# Patient Record
Sex: Male | Born: 1965
Health system: Southern US, Community
[De-identification: ages and names within clinical notes are randomized; demographics above are authoritative.]

## PROBLEM LIST (undated history)

## (undated) DIAGNOSIS — F909 Attention-deficit hyperactivity disorder, unspecified type: Secondary | ICD-10-CM

## (undated) DIAGNOSIS — I10 Essential (primary) hypertension: Secondary | ICD-10-CM

## (undated) DIAGNOSIS — M1990 Unspecified osteoarthritis, unspecified site: Secondary | ICD-10-CM

## (undated) HISTORY — DX: Essential (primary) hypertension: I10

## (undated) HISTORY — PX: COLONOSCOPY: SHX174

## (undated) HISTORY — PX: KNEE SURGERY: SHX244

## (undated) HISTORY — DX: Unspecified osteoarthritis, unspecified site: M19.90

---

## 2009-01-19 HISTORY — PX: APPENDECTOMY: SHX54

## 2009-02-08 ENCOUNTER — Emergency Department (HOSPITAL_COMMUNITY): Admission: EM | Admit: 2009-02-08 | Discharge: 2009-02-08 | Payer: Self-pay | Admitting: Family Medicine

## 2009-02-08 ENCOUNTER — Encounter (INDEPENDENT_AMBULATORY_CARE_PROVIDER_SITE_OTHER): Payer: Self-pay

## 2009-02-08 ENCOUNTER — Ambulatory Visit (HOSPITAL_COMMUNITY): Admission: EM | Admit: 2009-02-08 | Discharge: 2009-02-09 | Payer: Self-pay | Admitting: Emergency Medicine

## 2010-04-07 LAB — CBC
HCT: 41.9 % (ref 39.0–52.0)
MCV: 92 fL (ref 78.0–100.0)
RBC: 4.55 MIL/uL (ref 4.22–5.81)
WBC: 9.4 10*3/uL (ref 4.0–10.5)

## 2010-04-07 LAB — DIFFERENTIAL
Basophils Absolute: 0 10*3/uL (ref 0.0–0.1)
Basophils Relative: 0 % (ref 0–1)
Eosinophils Absolute: 0.1 10*3/uL (ref 0.0–0.7)
Eosinophils Relative: 1 % (ref 0–5)
Lymphocytes Relative: 10 % — ABNORMAL LOW (ref 12–46)

## 2010-04-07 LAB — COMPREHENSIVE METABOLIC PANEL
AST: 28 U/L (ref 0–37)
Alkaline Phosphatase: 40 U/L (ref 39–117)
CO2: 27 mEq/L (ref 19–32)
Chloride: 102 mEq/L (ref 96–112)
Creatinine, Ser: 0.81 mg/dL (ref 0.4–1.5)
GFR calc Af Amer: >60 mL/min (ref >60)
GFR calc non Af Amer: >60 mL/min (ref >60)
Total Bilirubin: 1.2 mg/dL (ref 0.3–1.2)

## 2010-04-07 LAB — URINALYSIS, ROUTINE W REFLEX MICROSCOPIC
Ketones, ur: NEGATIVE mg/dL
Nitrite: NEGATIVE
Protein, ur: NEGATIVE mg/dL
Urobilinogen, UA: 0.2 mg/dL (ref 0.0–1.0)
pH: 7 (ref 5.0–8.0)

## 2010-12-08 IMAGING — CT CT ABD-PELV W/ CM
1 of 3 series · 14 of 32 positions shown, 19 images · IV contrast (agent unspecified)
Comparison: None.

CLINICAL DATA: Abdominal pain since yesterday.

CT ABDOMEN AND PELVIS WITH CONTRAST 02/08/2009:
TECHNIQUE: Multidetector CT imaging of the abdomen and pelvis was
performed following the standard protocol during bolus
administration of intravenous contrast.
Contrast: 100 ml Imnipaque-VMM IV.  Water utilized as oral
contrast.

[Series 2: abd/pelv with 5.0 b31f st · axial · 0.80mm/px · z∈[-616,-166]mm · 14 of 102 slices shown, 19 images]
[im 6/102  soft-tissue]
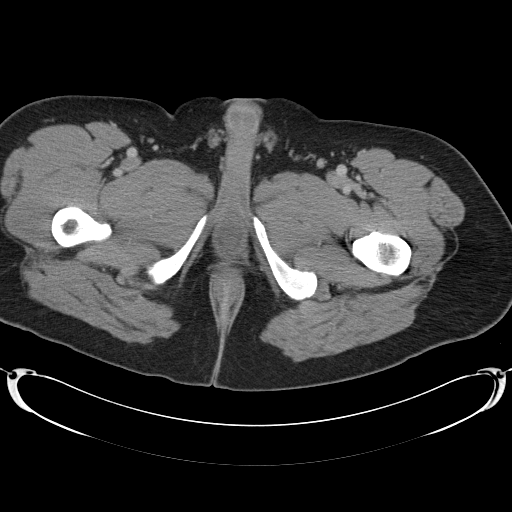
[im 6/102  bone]
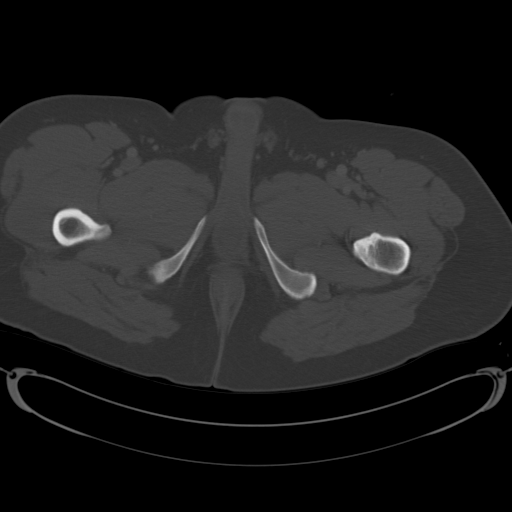
[im 16/102  soft-tissue]
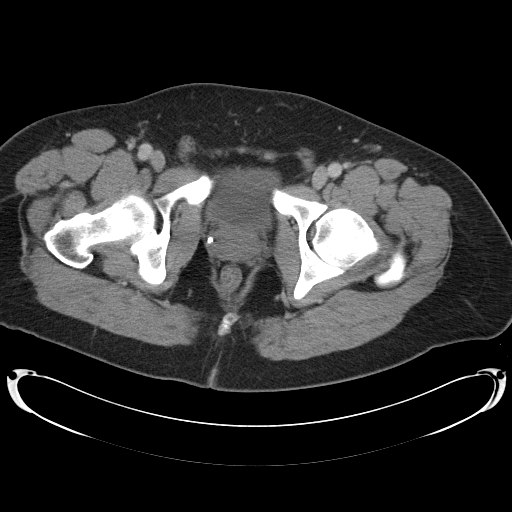
[im 21/102  soft-tissue]
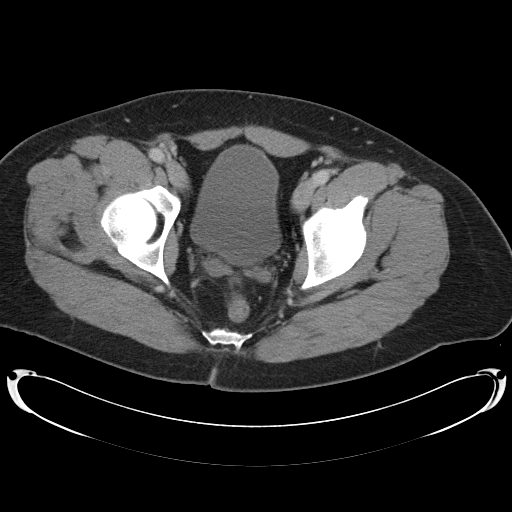
[im 31/102  soft-tissue]
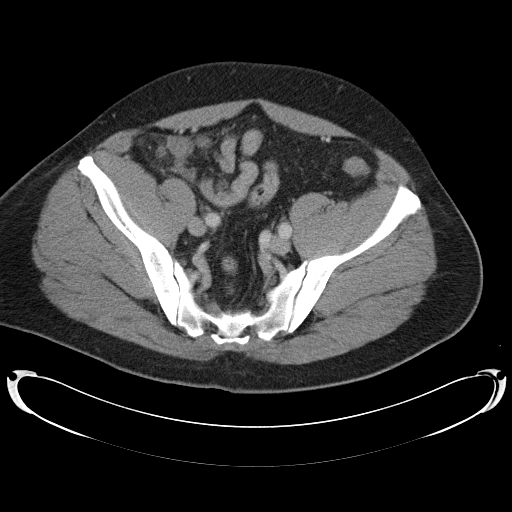
[im 36/102  soft-tissue]
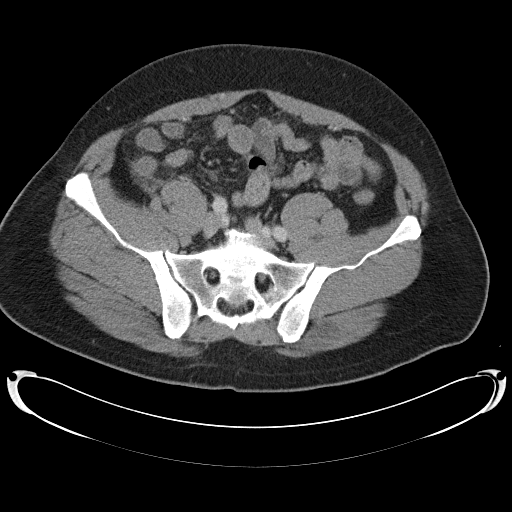
[im 46/102  soft-tissue]
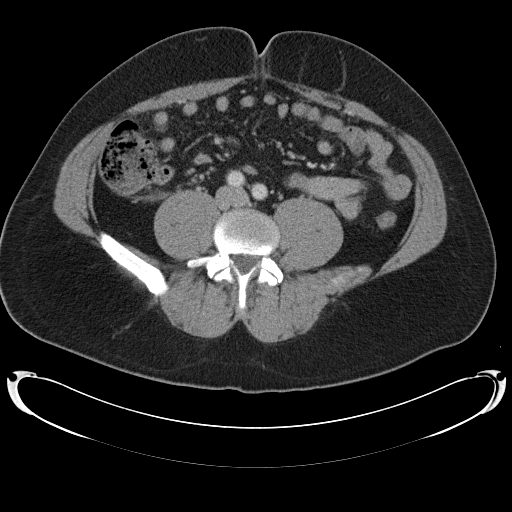
[im 51/102  soft-tissue]
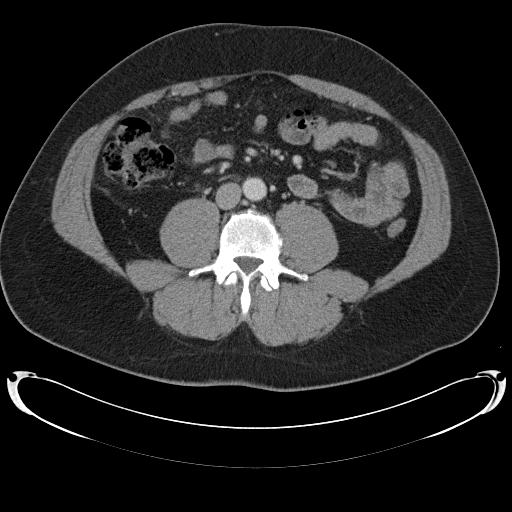
[im 56/102  soft-tissue]
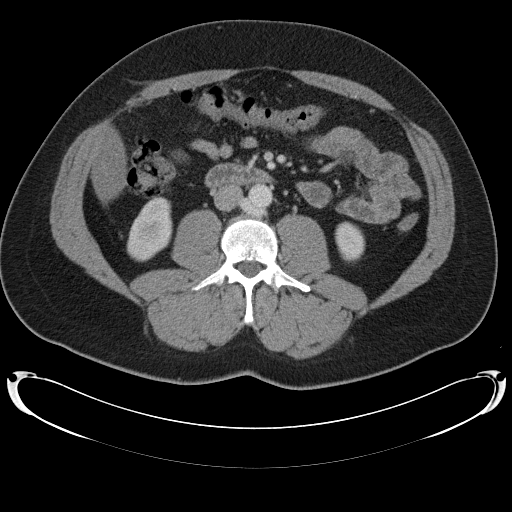
[im 66/102  soft-tissue]
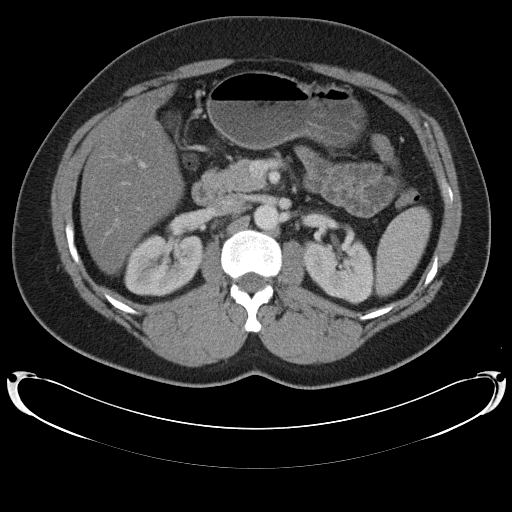
[im 66/102  bone]
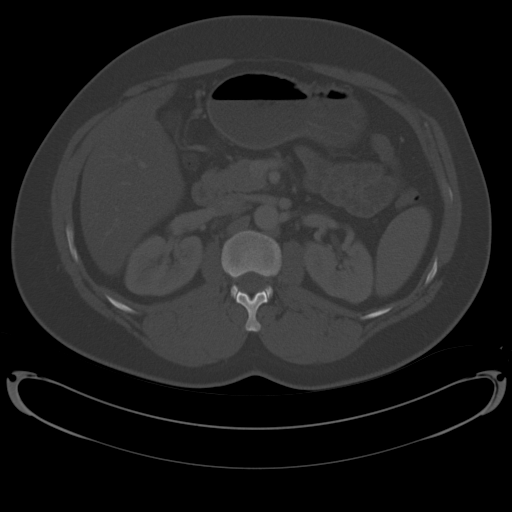
[im 71/102  soft-tissue]
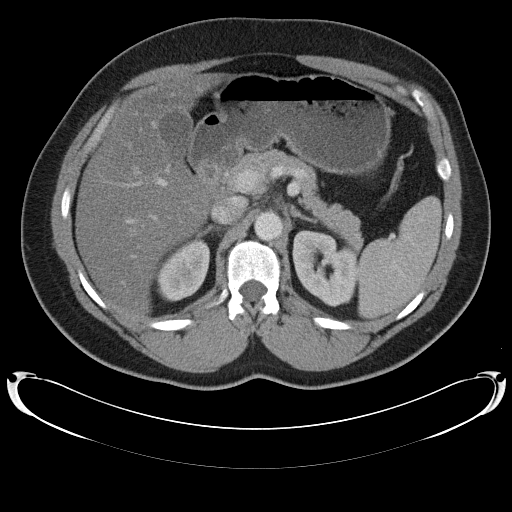
[im 81/102  soft-tissue]
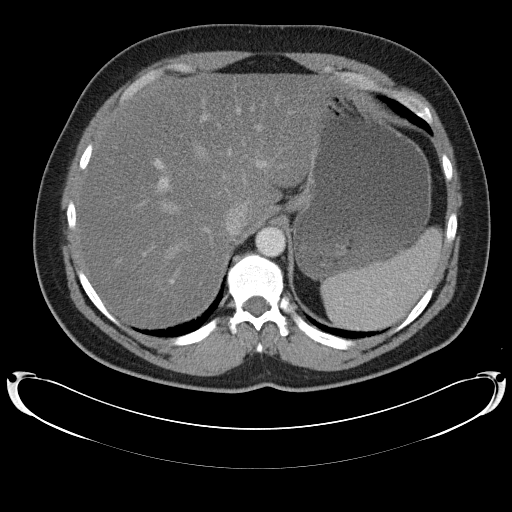
[im 81/102  lung]
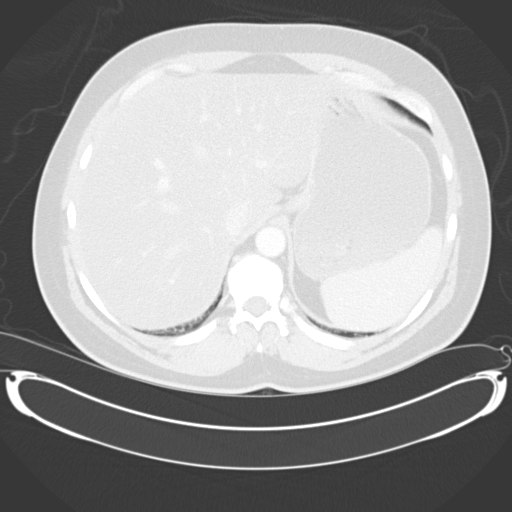
[im 86/102  soft-tissue]
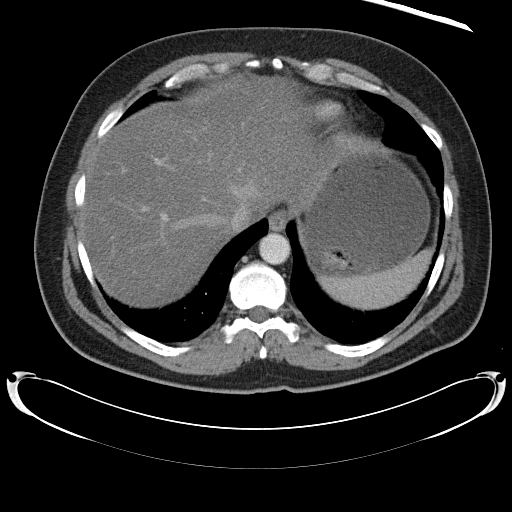
[im 86/102  lung]
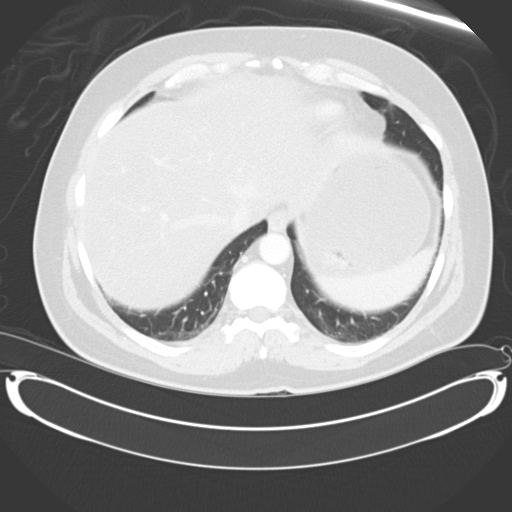
[im 91/102  lung]
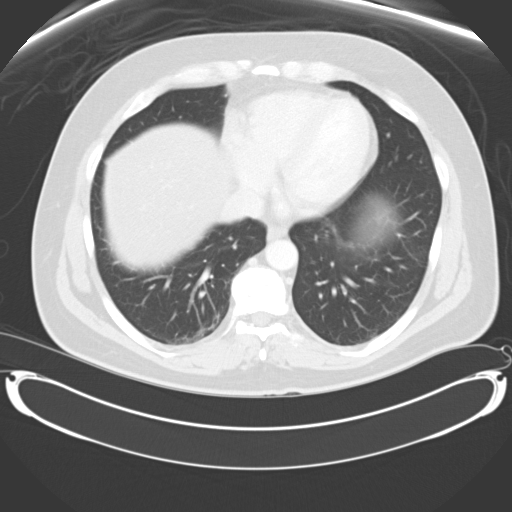
[im 96/102  soft-tissue]
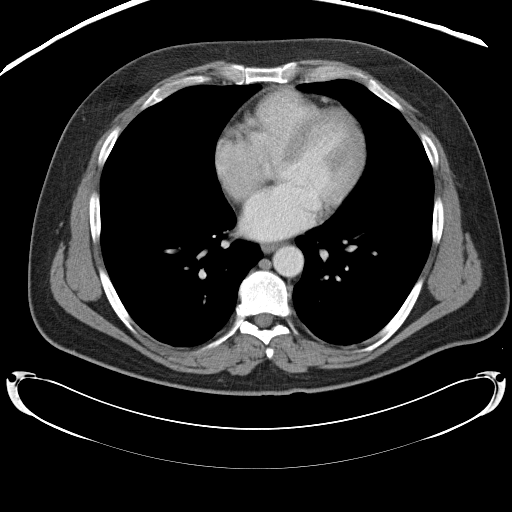
[im 96/102  lung]
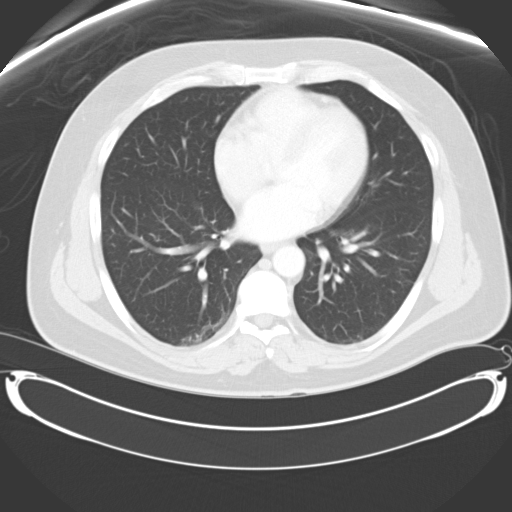

[14 of 32 positions shown; findings below may reference images not displayed]

FINDINGS: Dilated, fluid-filled appendix measuring up to
approximately 20 mm diameter. Appendicolith at the base of the
appendix.  Periappendiceal inflammation.  No abnormal fluid
collection.  Minimal ascites dependently in the pelvis.

Severe diffuse fatty infiltration of the liver with focal sparing
surrounding the gallbladder; no focal hepatic parenchymal
abnormality.  Normal appearing spleen, pancreas, adrenal glands,
and kidneys.  Gallbladder unremarkable by CT.  No biliary ductal
dilation.  Stomach, small bowel, and colon unremarkable. Normal
appearing abdominal aorta and iliofemoral arterial system.  No
significant lymphadenopathy.  Circumaortic left renal vein.

Normal seminal vesicles and prostate gland.  Phleboliths.  Urinary
bladder unremarkable.

Bone window images demonstrate lower thoracic spondylosis and mild
degenerative changes in the sacroiliac joints. Visualized lung
bases clear apart from the expected dependent atelectasis
posteriorly.
IMPRESSION: 1.  Acute appendicitis.  No evidence of periappendiceal abscess.
Minimal pelvic ascites.
2.  Severe diffuse fatty infiltration of the liver without focal
hepatic parenchymal abnormality.

Critical test results telephoned to Dr. Modzo in the emergency
department at the time of interpretation on 02/08/2009 at 6364
hours.

## 2011-02-19 ENCOUNTER — Encounter: Payer: Self-pay | Admitting: Emergency Medicine

## 2011-02-19 ENCOUNTER — Emergency Department
Admission: EM | Admit: 2011-02-19 | Discharge: 2011-02-19 | Disposition: A | Discharge status: Home / Self Care | Payer: Self-pay | Source: Home / Self Care | Attending: Emergency Medicine | Admitting: Emergency Medicine

## 2011-02-19 DIAGNOSIS — J029 Acute pharyngitis, unspecified: Secondary | ICD-10-CM

## 2011-02-19 DIAGNOSIS — J069 Acute upper respiratory infection, unspecified: Secondary | ICD-10-CM

## 2011-02-19 MED ORDER — AMOXICILLIN 875 MG PO TABS: 875.0000 mg | ORAL_TABLET | Freq: Two times a day (BID) | ORAL | Status: AC

## 2011-02-19 NOTE — ED Provider Notes (Signed)
History     CSN: [id number]  Arrival date & time 02/19/11  1635   First MD Initiated Contact with Patient 02/19/11 1641      Chief Complaint  Patient presents with  . Sore Throat    (Consider location/radiation/quality/duration/timing/severity/associated sxs/prior treatment) HPI Jesus Kennedy is a 46 y.o. male who complains of onset of cold symptoms for 4-5 days, but has had lingering symptoms for a few weeks.  + sore throat + cough No pleuritic pain No wheezing + nasal congestion + post-nasal drainage + sinus pain/pressure No chest congestion No itchy/red eyes No earache No hemoptysis No SOB + fever No nausea No vomiting No abdominal pain No diarrhea No skin rashes No fatigue No myalgias No headache    History reviewed. No pertinent past medical history.  No past surgical history on file.  No family history on file.  History  Substance Use Topics  . Smoking status: Not on file  . Smokeless tobacco: Not on file  . Alcohol Use: Not on file      Review of Systems  Allergies  Review of patient's allergies indicates no known allergies.  Home Medications  No current outpatient prescriptions on file.  BP 153/101  Pulse 69  Temp(Src) 98.3 F (36.8 C) (Oral)  Resp 16  Ht 5\' 9"  (1.753 m)  Wt 230 lb (104.327 kg)  BMI 33.96 kg/m2  SpO2 98%  Physical Exam  Nursing note and vitals reviewed. Constitutional: He is oriented to person, place, and time. He appears well-developed and well-nourished.  HENT:  Head: Normocephalic and atraumatic.  Right Ear: Tympanic membrane, external ear and ear canal normal.  Left Ear: Tympanic membrane, external ear and ear canal normal.  Nose: Mucosal edema and rhinorrhea present.  Mouth/Throat: Posterior oropharyngeal erythema present. No oropharyngeal exudate or posterior oropharyngeal edema.  Eyes: No scleral icterus.  Neck: Neck supple.  Cardiovascular: Regular rhythm and normal heart sounds.   Pulmonary/Chest: Effort  normal and breath sounds normal. No respiratory distress.  Neurological: He is alert and oriented to person, place, and time.  Skin: Skin is warm and dry.  Psychiatric: He has a normal mood and affect. His speech is normal.    ED Course  Procedures (including critical care time)  Labs Reviewed - No data to display No results found.   1. Acute pharyngitis   2. Acute upper respiratory infections of unspecified site       MDM  1)  Take the prescribed antibiotic as instructed.  Rapid strep negative.  No culture taken. 2)  Use nasal saline solution (over the counter) at least 3 times a day. 3)  Use over the counter decongestants like Zyrtec-D every 12 hours as needed to help with congestion.  If you have hypertension, do not take medicines with sudafed.  4)  Can take tylenol every 6 hours or motrin every 8 hours for pain or fever. 5)  Follow up with your primary doctor if no improvement in 5-7 days, sooner if increasing pain, fever, or new symptoms.     Lily Kocher, MD 02/19/11 [phone]

## 2011-02-19 NOTE — ED Notes (Signed)
Sore throat, runny nose x2 days.

## 2011-04-30 ENCOUNTER — Encounter: Payer: Self-pay | Admitting: Family Medicine

## 2011-04-30 ENCOUNTER — Ambulatory Visit
Admission: RE | Admit: 2011-04-30 | Discharge: 2011-04-30 | Disposition: A | Discharge status: Home / Self Care | Payer: Managed Care, Other (non HMO) | Source: Ambulatory Visit | Attending: Family Medicine | Admitting: Family Medicine

## 2011-04-30 ENCOUNTER — Ambulatory Visit (INDEPENDENT_AMBULATORY_CARE_PROVIDER_SITE_OTHER): Payer: Self-pay | Admitting: Family Medicine

## 2011-04-30 VITALS — BP 148/96 | HR 75 | Ht 68.0 in | Wt 228.0 lb

## 2011-04-30 DIAGNOSIS — R0789 Other chest pain: Secondary | ICD-10-CM

## 2011-04-30 DIAGNOSIS — I10 Essential (primary) hypertension: Secondary | ICD-10-CM | POA: Insufficient documentation

## 2011-04-30 DIAGNOSIS — R0602 Shortness of breath: Secondary | ICD-10-CM

## 2011-04-30 DIAGNOSIS — Z23 Encounter for immunization: Secondary | ICD-10-CM

## 2011-04-30 DIAGNOSIS — M2550 Pain in unspecified joint: Secondary | ICD-10-CM

## 2011-04-30 MED ORDER — LISINOPRIL 40 MG PO TABS: 40.0000 mg | ORAL_TABLET | Freq: Every day | ORAL | Status: DC

## 2011-04-30 MED ORDER — CYCLOBENZAPRINE HCL 10 MG PO TABS: 10.0000 mg | ORAL_TABLET | Freq: Every evening | ORAL | Status: DC | PRN

## 2011-04-30 NOTE — Progress Notes (Signed)
Subjective:    Patient ID: Jesus Kennedy, male    DOB: January 13, 1966, 46 y.o.   MRN: [id number]  HPI Chest Pain mostly at night.  Happens maybe once a week.  Last minutes.  Says can feel his heart beat when it happes. No fast beating.  Used to be on BP meds. Off for years.  Never had chest pain before. Takes a baby AS daily for heart health.  No alleviating or worsening factors. No known triggers.  Does more physical hard labor.  Working as an Psychologist, sport and exercise (Architectural technologist).  Has a lot of joint pain as well.  Hard to reach above 90 degrees in both shoulders. Can't lseep on either shoulder.  He works with really toxic chemicals.  Does wear a respirator.  No family hx of heart disease.  Father had aortic aneurysm, dx around age 74. Mother COPD, smoker. Not sleeping well for almost 3 years because of stress.  No family hx of RA, and autoimmune d/o.  He does report an occasional history of reflux and a vocational use over-the-counter Prilosec.  Insomnia - Says has tried Advil pM and melatnonin -Doesn' treally help.  Melatonin causes crazy dreams. He has a couple cups of coffee first thing in the morning but does not drink any other caffeine. He does track at the same time because of his early work schedule. He does have a lot of joint pain and shoulder pain which makes it difficult for him to relax and fall asleep. He's unable to sleep on either shoulder.   Review of Systems  Constitutional: Negative for fever, diaphoresis and unexpected weight change.  HENT: Positive for hearing loss. Negative for rhinorrhea, sneezing and tinnitus.        He says he has chronic ringing in his ear since starting this new job because machinery is very loud  Eyes: Negative for visual disturbance.  Respiratory: Positive for cough. Negative for wheezing.   Cardiovascular: Negative for chest pain and palpitations.  Gastrointestinal: Negative for nausea, vomiting, diarrhea and blood in stool.   Genitourinary: Negative for dysuria and discharge.  Musculoskeletal: Negative for myalgias and arthralgias.  Skin: Negative for rash.  Neurological: Negative for headaches.  Hematological: Negative for adenopathy.  Psychiatric/Behavioral: Positive for sleep disturbance. Negative for dysphoric mood. The patient is not nervous/anxious.     BP 148/96  Pulse 75  Ht 5\' 8"  (1.727 m)  Wt 228 lb (103.42 kg)  BMI 34.67 kg/m2    No Known Allergies  Past Medical History  Diagnosis Date  . Hypertension     Past Surgical History  Procedure Date  . Appendectomy 2011    History   Social History  . Marital Status: Married    Spouse Name: N/A    Number of Children: N/A  . Years of Education: N/A   Occupational History  . Not on file.   Social History Main Topics  . Smoking status: Never Smoker   . Smokeless tobacco: Not on file  . Alcohol Use: No  . Drug Use: No  . Sexually Active: Not on file   Other Topics Concern  . Not on file   Social History Narrative  . No narrative on file    Family History  Problem Relation Age of Onset  . Depression Mother   . Hyperlipidemia Mother   . Hypertension Mother     father  . COPD Mother     smoker    Outpatient Encounter Prescriptions as of  04/30/2011  Medication Sig Dispense Refill  . Naproxen Sodium (ALEVE PO) Take by mouth.      . cyclobenzaprine (FLEXERIL) 10 MG tablet Take 1 tablet (10 mg total) by mouth at bedtime as needed for muscle spasms.  20 tablet  0  . lisinopril (PRINIVIL,ZESTRIL) 40 MG tablet Take 1 tablet (40 mg total) by mouth daily.  30 tablet  2          Objective:   Physical Exam  Constitutional: He is oriented to person, place, and time. He appears well-developed and well-nourished.  HENT:  Head: Normocephalic and atraumatic.  Right Ear: External ear normal.  Left Ear: External ear normal.  Nose: Nose normal.  Mouth/Throat: Oropharynx is clear and moist.  Eyes: Conjunctivae and EOM are normal.  Pupils are equal, round, and reactive to light.  Neck: Normal range of motion. Neck supple. No thyromegaly present.  Cardiovascular: Normal rate, regular rhythm, normal heart sounds and intact distal pulses.        No carotid or abdominal bruits.  Pulmonary/Chest: Effort normal and breath sounds normal.  Abdominal: Soft. Bowel sounds are normal. He exhibits no distension and no mass. There is no tenderness. There is no rebound and no guarding.  Musculoskeletal: Normal range of motion. He exhibits no edema.       Shoulders with normal range of motion though he does have some difficulty getting to 180 extension. Normal crossover.  Lymphadenopathy:    He has no cervical adenopathy.  Neurological: He is alert and oriented to person, place, and time. He has normal reflexes. He displays normal reflexes.  Skin: Skin is warm and dry.  Psychiatric: He has a normal mood and affect. His behavior is normal. Judgment and thought content normal.          Assessment & Plan:  Atypical chest pain.- unclear etilology.  Certainly this could be related to his blood pressure. We will start him on a blood pressure medication. EKG shows NSR with rate 69 beats per minute, normal sinus rhythm, normal axis, normal intervals. Once again his blood pressure better control I want to see if he improves and his pain resolves. Also would like to better risk stratify him when to get his cholesterol et Karie Soda.  Hypertension-new diagnosis. We will start lisinopril 40 mg once a day. We discussed potential side effects. He is taking this years ago and did well on it I think he will be fine. We did check a complete metabolic panel and a lipid panel to better risk stratify him for possible heart disease. He has no prior family history of heart disease and his pain is atypical in nature. He has not any chest pain with activity. Followup in one month.  Shoulder pain - Discussed strain with possible mild rotator cuff strain. At this  point in time work on flexion exercises for range of motion and I gave him a small quantity of Flexeril to use only at bedtime. Warned about sedation and to avoid taking before operating heavy machinery or driving. He can use his anti-inflammatory as needed.  INsomnia - We discussed working on trying to improve some of his joint pains this is clearly disrupts his sleep. Also during his blood pressure under control and resolving his chest pain will help as well. He is not interested in any type of Ambien medication. We could consider something like trazodone or maybe even amitriptyline which may help with pain and sleep at the same time. Also part of his  insomnia seems to be stemming from his stress.

## 2011-05-01 ENCOUNTER — Other Ambulatory Visit: Payer: Self-pay | Admitting: *Deleted

## 2011-05-01 MED ORDER — CYCLOBENZAPRINE HCL 10 MG PO TABS: 10.0000 mg | ORAL_TABLET | Freq: Every evening | ORAL | Status: DC | PRN

## 2011-05-01 MED ORDER — LISINOPRIL 40 MG PO TABS: 40.0000 mg | ORAL_TABLET | Freq: Every day | ORAL | Status: DC

## 2011-05-05 ENCOUNTER — Encounter: Payer: Self-pay | Admitting: *Deleted

## 2011-05-08 ENCOUNTER — Other Ambulatory Visit: Payer: Self-pay | Admitting: Family Medicine

## 2011-05-08 DIAGNOSIS — R0789 Other chest pain: Secondary | ICD-10-CM

## 2011-05-08 LAB — CBC
HCT: 45 % (ref 39.0–52.0)
MCH: 32.5 pg (ref 26.0–34.0)
MCV: 96.8 fL (ref 78.0–100.0)
Platelets: 150 10*3/uL (ref 150–400)
RDW: 14.2 % (ref 11.5–15.5)

## 2011-05-08 LAB — CK TOTAL AND CKMB (NOT AT ARMC): Relative Index: 2.1 (ref 0.0–2.5)

## 2011-05-08 LAB — LIPID PANEL
Cholesterol: 172 mg/dL (ref 0–200)
HDL: 46 mg/dL (ref >39)
Total CHOL/HDL Ratio: 3.7 Ratio

## 2011-05-08 LAB — COMPLETE METABOLIC PANEL WITH GFR
Alkaline Phosphatase: 49 U/L (ref 39–117)
BUN: 16 mg/dL (ref 6–23)
CO2: 24 mEq/L (ref 19–32)
Creat: 0.97 mg/dL (ref 0.50–1.35)
GFR, Est African American: >89 mL/min
GFR, Est Non African American: >89 mL/min
Glucose, Bld: 90 mg/dL (ref 70–99)
Total Bilirubin: 0.8 mg/dL (ref 0.3–1.2)

## 2011-05-08 LAB — TROPONIN I: Troponin I: <0.01 ng/mL (ref <0.06)

## 2011-05-14 ENCOUNTER — Telehealth: Payer: Self-pay | Admitting: *Deleted

## 2011-05-14 NOTE — Telephone Encounter (Signed)
Left message for Jesus Kennedy to schedule exercise tolerance test per Dr. Linford Arnold from [facility] [phone].

## 2011-05-18 ENCOUNTER — Telehealth: Payer: Self-pay | Admitting: *Deleted

## 2011-05-18 ENCOUNTER — Encounter: Payer: Self-pay | Admitting: Family Medicine

## 2011-05-18 DIAGNOSIS — E785 Hyperlipidemia, unspecified: Secondary | ICD-10-CM | POA: Insufficient documentation

## 2011-05-18 DIAGNOSIS — F988 Other specified behavioral and emotional disorders with onset usually occurring in childhood and adolescence: Secondary | ICD-10-CM | POA: Insufficient documentation

## 2011-05-18 NOTE — Telephone Encounter (Signed)
Contacted Jennifer from Dr.Metheney office to let her know that I have made several attempts to call Mr. Mott to schedule Exercise Tolerance Test, ordered by Dr. Linford Arnold. Mr. Mccubbins has not returned any of my phone calls. Victorino Dike will relay message to Dr. Linford Arnold.

## 2011-06-01 ENCOUNTER — Ambulatory Visit (INDEPENDENT_AMBULATORY_CARE_PROVIDER_SITE_OTHER): Payer: Managed Care, Other (non HMO) | Admitting: Family Medicine

## 2011-06-01 ENCOUNTER — Encounter: Payer: Self-pay | Admitting: Family Medicine

## 2011-06-01 VITALS — BP 132/77 | HR 111 | Ht 68.0 in | Wt 233.0 lb

## 2011-06-01 DIAGNOSIS — R059 Cough, unspecified: Secondary | ICD-10-CM

## 2011-06-01 DIAGNOSIS — I10 Essential (primary) hypertension: Secondary | ICD-10-CM

## 2011-06-01 DIAGNOSIS — R05 Cough: Secondary | ICD-10-CM

## 2011-06-01 DIAGNOSIS — M25519 Pain in unspecified shoulder: Secondary | ICD-10-CM

## 2011-06-01 MED ORDER — CYCLOBENZAPRINE HCL 10 MG PO TABS: 10.0000 mg | ORAL_TABLET | Freq: Every evening | ORAL | Status: DC | PRN

## 2011-06-01 MED ORDER — LOSARTAN POTASSIUM 100 MG PO TABS: 100.0000 mg | ORAL_TABLET | Freq: Every day | ORAL | Status: DC

## 2011-06-01 NOTE — Progress Notes (Signed)
  Subjective:    Patient ID: Jesus Kennedy, male    DOB: [birth year], 46 y.o.   MRN: [id number]  HPI Cough x 1 months. Says initially started as a severe cold. Says initially in his sinuses and then moved into his chest. Cough was oworse at night. Now taking Allertec and it is heling him.  Some better but still cough and still productive. No fevers.   Shoulder Pain - Uses muscle relaxer infrequently in the evening, not every day.  Has been helping.  Has been doing the stretches. Now can reach above his head without sig pain. Much better. Taking Aleve daily No GI sxs. Takes with food.   HTN - Tolerating the medication well. No CP or SOB. CP has resolved but would like to get the stress testing.     Review of Systems     Objective:   Physical Exam  Constitutional: He is oriented to person, place, and time. He appears well-developed and well-nourished.  HENT:  Head: Normocephalic and atraumatic.  Right Ear: External ear normal.  Left Ear: External ear normal.  Nose: Nose normal.  Mouth/Throat: Oropharynx is clear and moist.       TMs and canals are clear. Turbinates are normal.   Eyes: Conjunctivae and EOM are normal. Pupils are equal, round, and reactive to light.  Neck: Neck supple. No thyromegaly present.  Cardiovascular: Normal rate and normal heart sounds.   Pulmonary/Chest: Effort normal and breath sounds normal.  Lymphadenopathy:    He has no cervical adenopathy.  Neurological: He is alert and oriented to person, place, and time.  Skin: Skin is warm and dry.  Psychiatric: He has a normal mood and affect.          Assessment & Plan:  Cough - Will change to losartan.  Sounds like was likely viral. Especially since started with a severe cold. It's been slowly getting better. If he continues to cough even after he changed his medication that I recommend getting a chest x-ray for further evaluation.  HTN - Will change to losartan. Well-controlled. Followup in 4 months.  Check BMP today since she started an ACE inhibitor to make sure potassium and creatinine are stable. We will work on getting a stress test.  Shoulder pain- is much improved with exercises and the Flexeril. Continue current regimen.

## 2011-06-02 LAB — BASIC METABOLIC PANEL
CO2: 27 mEq/L (ref 19–32)
Chloride: 102 mEq/L (ref 96–112)
Creat: 0.99 mg/dL (ref 0.50–1.35)
Potassium: 4.6 mEq/L (ref 3.5–5.3)

## 2011-06-02 NOTE — Progress Notes (Signed)
Quick Note:  All labs are normal. ______ 

## 2011-06-03 ENCOUNTER — Telehealth: Payer: Self-pay | Admitting: *Deleted

## 2011-06-03 DIAGNOSIS — R0789 Other chest pain: Secondary | ICD-10-CM

## 2011-06-19 ENCOUNTER — Encounter: Payer: Managed Care, Other (non HMO) | Admitting: Physician Assistant

## 2011-06-22 ENCOUNTER — Telehealth: Payer: Self-pay | Admitting: *Deleted

## 2011-06-22 NOTE — Telephone Encounter (Signed)
sharonda from [facility] called and states pt called to cancel his appt for a tread mill stress test. He did not say why and he didn't reschedule

## 2011-07-01 ENCOUNTER — Encounter: Payer: Managed Care, Other (non HMO) | Admitting: Physician Assistant

## 2011-07-10 ENCOUNTER — Ambulatory Visit (INDEPENDENT_AMBULATORY_CARE_PROVIDER_SITE_OTHER): Payer: Managed Care, Other (non HMO) | Admitting: Cardiovascular Disease

## 2011-07-10 ENCOUNTER — Encounter: Payer: Managed Care, Other (non HMO) | Admitting: Nurse Practitioner

## 2011-07-10 ENCOUNTER — Encounter: Payer: Self-pay | Admitting: Cardiovascular Disease

## 2011-07-10 DIAGNOSIS — R0789 Other chest pain: Secondary | ICD-10-CM

## 2011-07-10 DIAGNOSIS — R079 Chest pain, unspecified: Secondary | ICD-10-CM

## 2011-07-10 NOTE — Procedures (Signed)
Exercise Treadmill Test  Pre-Exercise Testing Evaluation Rhythm: normal sinus  Rate: 70   PR:  .14 QRS:  .10  QT:  .38 QTc: .41     Test  Exercise Tolerance Test Ordering MD: Nani Gasser , MD  Interpreting MD: Dr. Clifton James , MD  Unique Test No: 1  Treadmill:  1  Indication for ETT: chest pain - rule out ischemia  Contraindication to ETT: No   Stress Modality: exercise - treadmill  Cardiac Imaging Performed: non   Protocol: standard Bruce - maximal  Max BP: 199/89  Max MPHR (bpm):  177 85% MPR (bpm):  150  MPHR obtained (bpm)175  % MPHR obtained:97%    Reached 85% MPHR (min:sec):6:20 Total Exercise Time (min-sec):  9:00  Workload in METS:  10.1 Borg Scale: 15  Reason ETT Terminated:  fatigue    ST Segment Analysis At Rest: normal ST segments - no evidence of significant ST depression With Exercise: no evidence of significant ST depression  Other Information Arrhythmia:  No Angina during ETT:  absent (0) Quality of ETT:  non-diagnostic  ETT Interpretation:  normal - no evidence of ischemia by ST analysis  Comments: The patient exercised for 9 minutes of the Bruce protocol. He had no EKG changes or chest pain suggestive of ischemia.   Recommendations: No further cardiac workup. Daily exercise.

## 2011-09-25 ENCOUNTER — Encounter: Payer: Self-pay | Admitting: Family Medicine

## 2011-09-25 ENCOUNTER — Ambulatory Visit (INDEPENDENT_AMBULATORY_CARE_PROVIDER_SITE_OTHER): Payer: Managed Care, Other (non HMO) | Admitting: Family Medicine

## 2011-09-25 VITALS — BP 126/85 | HR 69 | Ht 68.0 in | Wt 234.0 lb

## 2011-09-25 DIAGNOSIS — Z77128 Contact with and (suspected) exposure to other hazards in the physical environment: Secondary | ICD-10-CM

## 2011-09-25 DIAGNOSIS — Z9189 Other specified personal risk factors, not elsewhere classified: Secondary | ICD-10-CM

## 2011-09-25 DIAGNOSIS — IMO0001 Reserved for inherently not codable concepts without codable children: Secondary | ICD-10-CM

## 2011-09-25 DIAGNOSIS — I10 Essential (primary) hypertension: Secondary | ICD-10-CM

## 2011-09-25 DIAGNOSIS — M791 Myalgia, unspecified site: Secondary | ICD-10-CM

## 2011-09-25 MED ORDER — CYCLOBENZAPRINE HCL 10 MG PO TABS: 10.0000 mg | ORAL_TABLET | Freq: Every evening | ORAL | Status: DC | PRN

## 2011-09-25 MED ORDER — LOSARTAN POTASSIUM 100 MG PO TABS: 100.0000 mg | ORAL_TABLET | Freq: Every day | ORAL | Status: DC

## 2011-09-25 NOTE — Progress Notes (Signed)
  Subjective:    Patient ID: Jesus Kennedy, male    DOB: [birth year], 46 y.o.   MRN: [id number]  HPI HTN - no CP or SOB. Taking meds without any S.E. Very phsically active job.   Very worried about environmental exposures at work.  Working as an Psychologist, sport and exercise (Architectural technologist). Has a lot of joint pain as well. Hard to reach above 90 degrees in both shoulders. Can't lseep on either shoulder. He works with really toxic chemicals. Does wear a respirator.  bilateral shoulder pain still present. Last time I saw him he was better with stretches, muscle relaxer and prn NSAID. Infact he had improved ROM. He says would like a refill on the muscle relaxer.    Review of Systems     Objective:   Physical Exam  Constitutional: He is oriented to person, place, and time. He appears well-developed and well-nourished.  HENT:  Head: Normocephalic and atraumatic.  Cardiovascular: Normal rate, regular rhythm and normal heart sounds.   Pulmonary/Chest: Effort normal and breath sounds normal.  Neurological: He is alert and oriented to person, place, and time.  Skin: Skin is warm and dry.  Psychiatric: He has a normal mood and affect. His behavior is normal.      Assessment & Plan:  HTN - Well controlled. Continue current regimen. F/U in 6 months. Lab Results  Component Value Date   WBC 5.6 05/07/2011   HGB 15.1 05/07/2011   HCT 45.0 05/07/2011   PLT 150 05/07/2011   GLUCOSE 103* 06/01/2011   CHOL 172 05/07/2011   TRIG 95 05/07/2011   HDL 46 05/07/2011   LDLCALC 107* 05/07/2011   ALT 48 05/07/2011   AST 34 05/07/2011   NA 138 06/01/2011   K 4.6 06/01/2011   CL 102 06/01/2011   CREATININE 0.99 06/01/2011   BUN 15 06/01/2011   CO2 27 06/01/2011   TSH 1.869 05/07/2011    Bilat shoulder pain. - he wants refill on his muscle relaxer.  Ok to refill. Discussed option of seeing sprots med for further evaluation.   Exposure to toxins-He works with toxic pain at work. Worried about  environmental toxins. 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    Orders Placed This Encounter  Procedures  . Heavy metals screen, urine  . Heavy metals, blood

## 2011-10-05 ENCOUNTER — Telehealth: Payer: Self-pay | Admitting: Family Medicine

## 2011-10-05 NOTE — Telephone Encounter (Signed)
Call pt: Please let him know I checked into occupation and environmental medicine with [place]. I think he is actually not have to go somewhere like [facility] to see if physician that specializes in terminal medicine. The phone number is [phone]. If he is interested in a referral I will be happy to place one to try to get him an appointment at [facility].

## 2011-10-06 NOTE — Telephone Encounter (Signed)
Left message on pt.'s vm.

## 2012-05-31 ENCOUNTER — Other Ambulatory Visit: Payer: Self-pay | Admitting: *Deleted

## 2012-05-31 MED ORDER — LOSARTAN POTASSIUM 100 MG PO TABS: 100.0000 mg | ORAL_TABLET | Freq: Every day | ORAL | Status: DC

## 2012-05-31 NOTE — Progress Notes (Signed)
Pt called and stated that he only has 2 bp pills left and was told he would need to come in prior to any refills. He made an appt for Monday 5.15.2014 however he is going out of town and that was the first available and he is asking for a refill. I told him that I would send in enough meds to get him through until his appt. Pt voiced understanding and agreed.Loralee Pacas [place]

## 2012-06-06 ENCOUNTER — Encounter: Payer: Self-pay | Admitting: Family Medicine

## 2012-06-06 ENCOUNTER — Ambulatory Visit (INDEPENDENT_AMBULATORY_CARE_PROVIDER_SITE_OTHER): Payer: Managed Care, Other (non HMO) | Admitting: Family Medicine

## 2012-06-06 VITALS — BP 128/86 | HR 73 | Wt 233.0 lb

## 2012-06-06 DIAGNOSIS — F988 Other specified behavioral and emotional disorders with onset usually occurring in childhood and adolescence: Secondary | ICD-10-CM

## 2012-06-06 DIAGNOSIS — I10 Essential (primary) hypertension: Secondary | ICD-10-CM

## 2012-06-06 DIAGNOSIS — R350 Frequency of micturition: Secondary | ICD-10-CM

## 2012-06-06 MED ORDER — LOSARTAN POTASSIUM 100 MG PO TABS: 100.0000 mg | ORAL_TABLET | Freq: Every day | ORAL | Status: DC

## 2012-06-06 MED ORDER — AMPHETAMINE-DEXTROAMPHETAMINE 10 MG PO TABS: 10.0000 mg | ORAL_TABLET | Freq: Two times a day (BID) | ORAL | Status: DC

## 2012-06-06 NOTE — Progress Notes (Signed)
  Subjective:    Patient ID: Jesus Kennedy, male    DOB: 12/18/1965, 47 y.o.   MRN: [id number]  HPI HTN -  Pt denies chest pain, SOB, dizziness, or heart palpitations.  Taking meds as directed w/o problems.  Denies medication side effects.  No palpiattions.    ADD  - Has been stressed.  Very unhappy with his job.  Wants refill on his stimulant. Sleep has been ok since cut back on alcohol.  No CP or SOB.    Wants to be tested for prostate cancer.  Friend ws recently diagnosed.  Wakes up at night to urinate more, and has done this for long time but he also drinks a lot of fluids because he has a very physically active job and sometimes works out in the heat. No difficulty in starting and stopping stream.  No family history of prostate cancer.   Review of Systems     Objective:   Physical Exam  Constitutional: He is oriented to person, place, and time. He appears well-developed and well-nourished.  HENT:  Head: Normocephalic and atraumatic.  Cardiovascular: Normal rate, regular rhythm and normal heart sounds.   Pulmonary/Chest: Effort normal and breath sounds normal.  Neurological: He is alert and oriented to person, place, and time.  Skin: Skin is warm and dry.  Psychiatric: He has a normal mood and affect. His behavior is normal.          Assessment & Plan:  HTN - well controlled. F/U in 6 months. RF sent to pharmacy.    ADD- will restart the Adderall. Start with 10 mg twice a day. We will keep an eye on his blood pressure make sure it's not causing any pounds. Call if he have any chest pain or palpitations or side effects on the medication.F/U in 3 months.   Nocturia-probably secondary to large intake of fluid. His AUA symptom score was 7 today. His only significant symptom was having to urinate less than 2 hours after you finish urinating. He rated his symptoms as mild and was pleased with his quality of life. We discussed the option of checking a PSA. No family history and  no significant symptom course I would not recommend a PSA at the age of 39. Certainly we could consider at age 71. But if he feels his symptoms are changing or worsening or he would feel more comfortable having it checked at our be happy to order it for him. After further discussion he opted to hold off on PSA testing. Certainly we can consider testing for enlarged prostate but he prefers to have a male do this exam.

## 2012-07-20 ENCOUNTER — Telehealth: Payer: Self-pay | Admitting: Family Medicine

## 2012-07-20 DIAGNOSIS — I10 Essential (primary) hypertension: Secondary | ICD-10-CM

## 2012-07-20 NOTE — Telephone Encounter (Signed)
Call patient: There has been a year since he had bulbar to check his kidney function and potassium. The medications that he takes it's important to check these levels twice year. I will fax a lab slip and he can go anytime to the lab. Please go fasting.

## 2012-07-20 NOTE — Telephone Encounter (Signed)
Pt called and lvm informing him to go get labs drawn and he will need to fast prior to these being done. Asked to call back with any questions.Loralee Pacas [place]

## 2012-09-06 ENCOUNTER — Ambulatory Visit: Payer: Managed Care, Other (non HMO) | Admitting: Family Medicine

## 2012-09-06 DIAGNOSIS — Z0289 Encounter for other administrative examinations: Secondary | ICD-10-CM

## 2012-09-14 ENCOUNTER — Other Ambulatory Visit: Payer: Self-pay | Admitting: *Deleted

## 2012-09-14 ENCOUNTER — Telehealth: Payer: Self-pay | Admitting: *Deleted

## 2012-09-14 DIAGNOSIS — E870 Hyperosmolality and hypernatremia: Secondary | ICD-10-CM

## 2012-09-14 MED ORDER — AMPHETAMINE-DEXTROAMPHETAMINE 10 MG PO TABS: 10.0000 mg | ORAL_TABLET | Freq: Two times a day (BID) | ORAL | Status: DC

## 2012-09-14 NOTE — Telephone Encounter (Signed)
cmp only ordered.

## 2012-09-16 LAB — COMPLETE METABOLIC PANEL WITH GFR
AST: 26 U/L (ref 0–37)
Albumin: 4.9 g/dL (ref 3.5–5.2)
BUN: 20 mg/dL (ref 6–23)
Calcium: 9.7 mg/dL (ref 8.4–10.5)
Chloride: 106 mEq/L (ref 96–112)
Creat: 0.98 mg/dL (ref 0.50–1.35)
GFR, Est African American: 108 mL/min
GFR, Est Non African American: 93 mL/min
Glucose, Bld: 89 mg/dL (ref 70–99)
Potassium: 4.5 mEq/L (ref 3.5–5.3)

## 2012-09-16 LAB — LIPID PANEL
Cholesterol: 193 mg/dL (ref 0–200)
Triglycerides: 82 mg/dL (ref <150)
VLDL: 16 mg/dL (ref 0–40)

## 2013-01-29 ENCOUNTER — Other Ambulatory Visit: Payer: Self-pay | Admitting: Family Medicine

## 2013-01-30 ENCOUNTER — Telehealth: Payer: Self-pay | Admitting: *Deleted

## 2013-01-30 MED ORDER — AMPHETAMINE-DEXTROAMPHETAMINE 10 MG PO TABS: 10.0000 mg | ORAL_TABLET | Freq: Two times a day (BID) | ORAL | Status: DC

## 2013-01-30 NOTE — Telephone Encounter (Signed)
Refill for adderall.Jesus Kennedy [place]

## 2013-02-10 ENCOUNTER — Ambulatory Visit: Payer: Managed Care, Other (non HMO) | Admitting: Family Medicine

## 2013-05-19 ENCOUNTER — Other Ambulatory Visit: Payer: Self-pay | Admitting: Family Medicine

## 2014-05-07 ENCOUNTER — Encounter: Payer: Self-pay | Admitting: Family Medicine

## 2014-05-07 ENCOUNTER — Ambulatory Visit (INDEPENDENT_AMBULATORY_CARE_PROVIDER_SITE_OTHER): Payer: PRIVATE HEALTH INSURANCE | Admitting: Family Medicine

## 2014-05-07 VITALS — BP 138/96 | HR 64 | Ht 68.0 in | Wt 233.0 lb

## 2014-05-07 DIAGNOSIS — Z114 Encounter for screening for human immunodeficiency virus [HIV]: Secondary | ICD-10-CM

## 2014-05-07 DIAGNOSIS — E785 Hyperlipidemia, unspecified: Secondary | ICD-10-CM

## 2014-05-07 DIAGNOSIS — I10 Essential (primary) hypertension: Secondary | ICD-10-CM | POA: Diagnosis not present

## 2014-05-07 MED ORDER — AMPHETAMINE-DEXTROAMPHETAMINE 10 MG PO TABS: 10.0000 mg | ORAL_TABLET | Freq: Two times a day (BID) | ORAL | Status: DC

## 2014-05-07 MED ORDER — LOSARTAN POTASSIUM 100 MG PO TABS: ORAL_TABLET | ORAL | Status: DC

## 2014-05-07 NOTE — Progress Notes (Signed)
   Subjective:    Patient ID: Jesus Kennedy, male    DOB: 1965/11/06, 49 y.o.   MRN: [id number]  HPI BP f/u - hasn't been seen in a year. Says he wasn't able to make an appt. Well controlled  On medication.Has been out of his medication. Feel tired all the time.  He changed jobs and is more active.  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  Does snore aording to his wife.    Review of Systems     Objective:   Physical Exam  Constitutional: He is oriented to person, place, and time. He appears well-developed and well-nourished.  HENT:  Head: Normocephalic and atraumatic.  Cardiovascular: Normal rate, regular rhythm and normal heart sounds.   Pulmonary/Chest: Effort normal and breath sounds normal.  Neurological: He is alert and oriented to person, place, and time.  Skin: Skin is warm and dry.  Psychiatric: He has a normal mood and affect. His behavior is normal.          Assessment & Plan:  HTN - Uncontrolled.  Will restart medication if up in 3 months for blood pressure check. If at goal then we'll just follow-up every 6 months. History for CMP and fasting lipid panel. Labs the provided today and he plans on going this afternoon since he is fasting. Consider evaluate for OSA in future.   Hyperlipidemia-we'll check lipid panel.  He has a strong family history of thyroid problems. His sister is borderline, his brother and his father both have hypothyroidism. Will check TSH on his blood work as well.

## 2014-05-08 LAB — COMPLETE METABOLIC PANEL WITH GFR
ALT: 40 U/L (ref 0–53)
AST: 31 U/L (ref 0–37)
Albumin: 4.7 g/dL (ref 3.5–5.2)
Alkaline Phosphatase: 46 U/L (ref 39–117)
BILIRUBIN TOTAL: 0.9 mg/dL (ref 0.2–1.2)
BUN: 14 mg/dL (ref 6–23)
CALCIUM: 9.7 mg/dL (ref 8.4–10.5)
CHLORIDE: 101 meq/L (ref 96–112)
CO2: 23 meq/L (ref 19–32)
CREATININE: 0.93 mg/dL (ref 0.50–1.35)
GFR, Est Non African American: >89 mL/min
GLUCOSE: 89 mg/dL (ref 70–99)
POTASSIUM: 4.3 meq/L (ref 3.5–5.3)
SODIUM: 138 meq/L (ref 135–145)
Total Protein: 7.6 g/dL (ref 6.0–8.3)

## 2014-05-08 LAB — LIPID PANEL
Cholesterol: 202 mg/dL — ABNORMAL HIGH (ref 0–200)
HDL: 50 mg/dL (ref >=40)
LDL CALC: 116 mg/dL — AB (ref 0–99)
TRIGLYCERIDES: 182 mg/dL — AB (ref <150)
Total CHOL/HDL Ratio: 4 Ratio
VLDL: 36 mg/dL (ref 0–40)

## 2014-05-08 LAB — TSH: TSH: 2.233 u[IU]/mL (ref 0.350–4.500)

## 2014-08-03 ENCOUNTER — Encounter: Payer: Self-pay | Admitting: Family Medicine

## 2014-08-03 ENCOUNTER — Ambulatory Visit (INDEPENDENT_AMBULATORY_CARE_PROVIDER_SITE_OTHER): Payer: PRIVATE HEALTH INSURANCE | Admitting: Family Medicine

## 2014-08-03 VITALS — BP 124/79 | HR 64 | Ht 68.0 in | Wt 234.0 lb

## 2014-08-03 DIAGNOSIS — I10 Essential (primary) hypertension: Secondary | ICD-10-CM | POA: Diagnosis not present

## 2014-08-03 DIAGNOSIS — F988 Other specified behavioral and emotional disorders with onset usually occurring in childhood and adolescence: Secondary | ICD-10-CM

## 2014-08-03 DIAGNOSIS — F909 Attention-deficit hyperactivity disorder, unspecified type: Secondary | ICD-10-CM

## 2014-08-03 MED ORDER — AMPHETAMINE-DEXTROAMPHETAMINE 10 MG PO TABS
10.0000 mg | ORAL_TABLET | Freq: Two times a day (BID) | ORAL | Status: DC
Start: 2014-08-03 — End: 2015-02-05

## 2014-08-03 MED ORDER — LOSARTAN POTASSIUM 100 MG PO TABS: ORAL_TABLET | ORAL | Status: DC

## 2014-08-03 NOTE — Progress Notes (Signed)
   Subjective:    Patient ID: Jesus Kennedy, male    DOB: [birth year], 49 y.o.   MRN: [id number]  HPI Hypertension- Pt denies chest pain, SOB, dizziness, or heart palpitations.  Taking meds as directed w/o problems.  Denies medication side effects.  Has been exercising.  ADD follow-up. Currently taking Adderall 10 mg tab. No chest pain or palpitations with the medication. He is only taking one a tab.     Review of Systems     Objective:   Physical Exam  Constitutional: He is oriented to person, place, and time. He appears well-developed and well-nourished.  HENT:  Head: Normocephalic and atraumatic.  Cardiovascular: Normal rate, regular rhythm and normal heart sounds.   Pulmonary/Chest: Effort normal and breath sounds normal.  Neurological: He is alert and oriented to person, place, and time.  Skin: Skin is warm and dry.  Psychiatric: He has a normal mood and affect. His behavior is normal.          Assessment & Plan:  HTN- well controlled. F/U in 6 months. Labs up to date  ADD- well controlled on one a day.  Will keep the script the same.

## 2015-02-01 ENCOUNTER — Ambulatory Visit: Payer: PRIVATE HEALTH INSURANCE | Admitting: Family Medicine

## 2015-02-05 ENCOUNTER — Encounter: Payer: Self-pay | Admitting: Family Medicine

## 2015-02-05 ENCOUNTER — Ambulatory Visit (INDEPENDENT_AMBULATORY_CARE_PROVIDER_SITE_OTHER): Payer: Managed Care, Other (non HMO) | Admitting: Family Medicine

## 2015-02-05 VITALS — BP 143/97 | HR 67 | Wt 235.0 lb

## 2015-02-05 DIAGNOSIS — F909 Attention-deficit hyperactivity disorder, unspecified type: Secondary | ICD-10-CM

## 2015-02-05 DIAGNOSIS — I10 Essential (primary) hypertension: Secondary | ICD-10-CM | POA: Diagnosis not present

## 2015-02-05 DIAGNOSIS — F988 Other specified behavioral and emotional disorders with onset usually occurring in childhood and adolescence: Secondary | ICD-10-CM

## 2015-02-05 LAB — BASIC METABOLIC PANEL WITH GFR
BUN: 17 mg/dL (ref 7–25)
CALCIUM: 9.5 mg/dL (ref 8.6–10.3)
CO2: 24 mmol/L (ref 20–31)
CREATININE: 1.09 mg/dL (ref 0.60–1.35)
Chloride: 106 mmol/L (ref 98–110)
GFR, Est African American: >89 mL/min (ref >=60)
GFR, Est Non African American: 80 mL/min (ref >=60)
GLUCOSE: 98 mg/dL (ref 65–99)
Potassium: 4.7 mmol/L (ref 3.5–5.3)
SODIUM: 141 mmol/L (ref 135–146)

## 2015-02-05 MED ORDER — AMPHETAMINE-DEXTROAMPHETAMINE 10 MG PO TABS: 10.0000 mg | ORAL_TABLET | Freq: Two times a day (BID) | ORAL | Status: DC

## 2015-02-05 NOTE — Progress Notes (Signed)
   Subjective:    Patient ID: Jesus Kennedy, male    DOB: 1965/02/28, 50 y.o.   MRN: [id number]  HPI Hypertension- Pt denies chest pain, SOB, dizziness, or heart palpitations.  Taking meds as directed w/o problems.  Denies medication side effects.    ADD - Only taking Adderall once a day and doing well with that.  No chest pain, short of breath, palpitations with taking the medication. He just takes it occasionally. In fact 60 tabs has lasted him several months. No insomnia on the medication.   Review of Systems     Objective:   Physical Exam  Constitutional: He is oriented to person, place, and time. He appears well-developed and well-nourished.  HENT:  Head: Normocephalic and atraumatic.  Cardiovascular: Normal rate, regular rhythm and normal heart sounds.   Pulmonary/Chest: Effort normal and breath sounds normal.  Neurological: He is alert and oriented to person, place, and time.  Skin: Skin is warm and dry.  Psychiatric: He has a normal mood and affect. His behavior is normal.          Assessment & Plan:  HTN - Normally well controlled. F/U in 6 months.  Work on diet abnd exercise.  Due for BMP.  ADD - well controlled on current regimen.  Med refilled today.

## 2015-02-06 NOTE — Progress Notes (Signed)
Quick Note:  All labs are normal. ______ 

## 2015-04-08 ENCOUNTER — Other Ambulatory Visit: Payer: Self-pay | Admitting: Family Medicine

## 2015-08-05 ENCOUNTER — Encounter: Payer: Self-pay | Admitting: Family Medicine

## 2015-08-05 ENCOUNTER — Ambulatory Visit (INDEPENDENT_AMBULATORY_CARE_PROVIDER_SITE_OTHER): Payer: Managed Care, Other (non HMO) | Admitting: Family Medicine

## 2015-08-05 VITALS — BP 120/82 | HR 78 | Wt 235.0 lb

## 2015-08-05 DIAGNOSIS — M791 Myalgia: Secondary | ICD-10-CM

## 2015-08-05 DIAGNOSIS — R5383 Other fatigue: Secondary | ICD-10-CM

## 2015-08-05 DIAGNOSIS — F988 Other specified behavioral and emotional disorders with onset usually occurring in childhood and adolescence: Secondary | ICD-10-CM

## 2015-08-05 DIAGNOSIS — I10 Essential (primary) hypertension: Secondary | ICD-10-CM | POA: Diagnosis not present

## 2015-08-05 DIAGNOSIS — IMO0001 Reserved for inherently not codable concepts without codable children: Secondary | ICD-10-CM

## 2015-08-05 DIAGNOSIS — F909 Attention-deficit hyperactivity disorder, unspecified type: Secondary | ICD-10-CM

## 2015-08-05 DIAGNOSIS — M609 Myositis, unspecified: Secondary | ICD-10-CM

## 2015-08-05 MED ORDER — AMPHETAMINE-DEXTROAMPHETAMINE 10 MG PO TABS: 10.0000 mg | ORAL_TABLET | Freq: Two times a day (BID) | ORAL | Status: DC

## 2015-08-05 MED ORDER — LOSARTAN POTASSIUM 100 MG PO TABS: 100.0000 mg | ORAL_TABLET | Freq: Every day | ORAL | Status: DC

## 2015-08-05 NOTE — Progress Notes (Signed)
Subjective:    CC: HTN  HPI: Hypertension- Pt denies chest pain, SOB, dizziness, or heart palpitations.  Taking meds as directed w/o problems.  Denies medication side effects.    ADD - Is doing well overall. He denies any chest pain shortness of breath or palpitations on the medications. He denies any insomnia. He says he never takes the past 2:00 in the afternoon. He's really tried to increase his water intake and back off of caffeine.  He does complain of calf and thigh pain. It's been going on for a couple of months. He says it is just feels sore and amiss like a heaviness. It seems to be worse at the end of the day and is still there when he wakes up the next morning. He is fairly physically active. Healing trauma or injury.  Past medical history, Surgical history, Family history not pertinant except as noted below, Social history, Allergies, and medications have been entered into the medical record, reviewed, and corrections made.   Review of Systems: No fevers, chills, night sweats, weight loss, chest pain, or shortness of breath.   Objective:    General: Well Developed, well nourished, and in no acute distress.  Neuro: Alert and oriented x3, extra-ocular muscles intact, sensation grossly intact.  HEENT: Normocephalic, atraumatic  Skin: Warm and dry, no rashes. Cardiac: Regular rate and rhythm, no murmurs rubs or gallops, no lower extremity edema.  Respiratory: Clear to auscultation bilaterally. Not using accessory muscles, speaking in full sentences.   Impression and Recommendations:   HTN - Well controlled. Continue current regimen. Follow up in 6 months. Due for updated lab work. Lab slip provided.   ADD - Well controlled. Happy with his regimen. Refills provided today. Call if any palms concerns chest pain etc.  Myalgias-unclear etiology at this point. Some of this may just be fatigue related but will check a CK and inflammatory enzymes. No swelling of the joints. Not  consistent with DVT or blood clot. It is not affecting the torso or upper extremities. Did encourage him to consider compression stockings as well since he does get some trace edema at the end of the day around his lower legs.

## 2015-08-06 LAB — COMPLETE METABOLIC PANEL WITH GFR
ALBUMIN: 4.6 g/dL (ref 3.6–5.1)
ALK PHOS: 43 U/L (ref 40–115)
ALT: 48 U/L — ABNORMAL HIGH (ref 9–46)
AST: 28 U/L (ref 10–40)
BUN: 14 mg/dL (ref 7–25)
CALCIUM: 10 mg/dL (ref 8.6–10.3)
CHLORIDE: 103 mmol/L (ref 98–110)
CO2: 24 mmol/L (ref 20–31)
Creat: 1.11 mg/dL (ref 0.60–1.35)
GFR, Est African American: >89 mL/min (ref >=60)
GFR, Est Non African American: 79 mL/min (ref >=60)
Glucose, Bld: 96 mg/dL (ref 65–99)
Potassium: 4.6 mmol/L (ref 3.5–5.3)
Sodium: 139 mmol/L (ref 135–146)
Total Bilirubin: 0.7 mg/dL (ref 0.2–1.2)
Total Protein: 7.4 g/dL (ref 6.1–8.1)

## 2015-08-06 LAB — LIPID PANEL
CHOLESTEROL: 234 mg/dL — AB (ref 125–200)
HDL: 49 mg/dL (ref >=40)
LDL Cholesterol: 151 mg/dL — ABNORMAL HIGH (ref <130)
Total CHOL/HDL Ratio: 4.8 Ratio (ref <=5.0)
Triglycerides: 169 mg/dL — ABNORMAL HIGH (ref <150)
VLDL: 34 mg/dL — AB (ref <30)

## 2015-08-06 LAB — CK: CK TOTAL: 136 U/L (ref 7–232)

## 2015-08-06 LAB — CBC
HCT: 45.1 % (ref 38.5–50.0)
HEMOGLOBIN: 15.5 g/dL (ref 13.2–17.1)
MCH: 32.7 pg (ref 27.0–33.0)
MCHC: 34.4 g/dL (ref 32.0–36.0)
MCV: 95.1 fL (ref 80.0–100.0)
MPV: 10.7 fL (ref 7.5–12.5)
Platelets: 155 10*3/uL (ref 140–400)
RBC: 4.74 MIL/uL (ref 4.20–5.80)
RDW: 13.7 % (ref 11.0–15.0)
WBC: 5.9 10*3/uL (ref 3.8–10.8)

## 2015-08-06 LAB — SEDIMENTATION RATE: SED RATE: 4 mm/h (ref 0–15)

## 2015-08-06 LAB — TSH: TSH: 4.14 mIU/L (ref 0.40–4.50)

## 2015-08-06 LAB — C-REACTIVE PROTEIN

## 2016-02-10 ENCOUNTER — Ambulatory Visit: Payer: Managed Care, Other (non HMO) | Admitting: Family Medicine

## 2016-02-13 ENCOUNTER — Encounter: Payer: Self-pay | Admitting: Family Medicine

## 2016-02-13 ENCOUNTER — Ambulatory Visit (INDEPENDENT_AMBULATORY_CARE_PROVIDER_SITE_OTHER): Payer: BLUE CROSS/BLUE SHIELD | Admitting: Family Medicine

## 2016-02-13 VITALS — BP 133/85 | HR 65 | Ht 68.0 in | Wt 240.0 lb

## 2016-02-13 DIAGNOSIS — I10 Essential (primary) hypertension: Secondary | ICD-10-CM

## 2016-02-13 DIAGNOSIS — F988 Other specified behavioral and emotional disorders with onset usually occurring in childhood and adolescence: Secondary | ICD-10-CM

## 2016-02-13 DIAGNOSIS — M25512 Pain in left shoulder: Secondary | ICD-10-CM

## 2016-02-13 DIAGNOSIS — M25511 Pain in right shoulder: Secondary | ICD-10-CM

## 2016-02-13 MED ORDER — CELECOXIB 100 MG PO CAPS: 100.0000 mg | ORAL_CAPSULE | Freq: Two times a day (BID) | ORAL | 5 refills | Status: DC | PRN

## 2016-02-13 MED ORDER — AMPHETAMINE-DEXTROAMPHETAMINE 10 MG PO TABS: 10.0000 mg | ORAL_TABLET | Freq: Two times a day (BID) | ORAL | 0 refills | Status: DC

## 2016-02-13 NOTE — Progress Notes (Addendum)
Subjective:    CC: HTN, ADD  HPI: Hypertension- Pt denies chest pain, SOB, dizziness, or heart palpitations.  Taking meds as directed w/o problems.  Denies medication side effects.  Weight is up 5 lbs.     ADD - Currently doing well on his Adderall. No chest pain, shortness breath, palpitations or insomnia on the medication and he feels like it's working effectively.  He also complains of some bilateral shoulder pain. If this is been going on for a long time. He mostly takes Aleve and that does provide some mild relief but he doesn't like to take too much of it and so often times will wake up and pain at night. Injury years ago where he pulled the muscle behind the scapula and still has pain there as well. He feels some popping in his right shoulder in particular specially when he first gets up in the morning. He does do some heavy lifting pushing and pulling at work. He is interested in trying Celebrex to see if it may provide better pain relief.  Past medical history, Surgical history, Family history not pertinant except as noted below, Social history, Allergies, and medications have been entered into the medical record, reviewed, and corrections made.   Review of Systems: No fevers, chills, night sweats, weight loss, chest pain, or shortness of breath.   Objective:    General: Well Developed, well nourished, and in no acute distress.  Neuro: Alert and oriented x3, extra-ocular muscles intact, sensation grossly intact.  HEENT: Normocephalic, atraumatic  Skin: Warm and dry, no rashes. Cardiac: Regular rate and rhythm, no murmurs rubs or gallops, no lower extremity edema.  Respiratory: Clear to auscultation bilaterally. Not using accessory muscles, speaking in full sentences. Ext: shoulder with NROM.     Impression and Recommendations:   HTN - Well controlled. Continue current regimen. Follow up in  6 months.   ADD - Controlled. Continue current regimen. We did make sure we monitor blood  pressure and it stays well-controlled to be on a stimulant. 90 day supply prescriptions given today. Next  Bilateral shoulder pain with the right being worse. He does have full range of motions and no limitation but persistent pain. Will try Celebrex for pain relief. I encouraged him to follow with one of our sports medicine providers to further evaluate the area and consider other treatments including injections physical therapy etc. He says right now he has a new [place] doesn't really want to take off work but encouraged him to come in when it is convenient for him  Note, he had to redo a root canal, dental work recently so he is actually on clindamycin today and has about 4 more days to complete.

## 2016-06-11 ENCOUNTER — Ambulatory Visit (INDEPENDENT_AMBULATORY_CARE_PROVIDER_SITE_OTHER): Payer: BLUE CROSS/BLUE SHIELD | Admitting: Family Medicine

## 2016-06-11 ENCOUNTER — Encounter: Payer: Self-pay | Admitting: Family Medicine

## 2016-06-11 VITALS — BP 124/76 | HR 67 | Ht 68.0 in | Wt 230.0 lb

## 2016-06-11 DIAGNOSIS — F988 Other specified behavioral and emotional disorders with onset usually occurring in childhood and adolescence: Secondary | ICD-10-CM

## 2016-06-11 DIAGNOSIS — Z6835 Body mass index (BMI) 35.0-35.9, adult: Secondary | ICD-10-CM

## 2016-06-11 DIAGNOSIS — I10 Essential (primary) hypertension: Secondary | ICD-10-CM | POA: Diagnosis not present

## 2016-06-11 MED ORDER — AMPHETAMINE-DEXTROAMPHETAMINE 10 MG PO TABS: 10.0000 mg | ORAL_TABLET | Freq: Two times a day (BID) | ORAL | 0 refills | Status: DC

## 2016-06-11 MED ORDER — LOSARTAN POTASSIUM 100 MG PO TABS: 100.0000 mg | ORAL_TABLET | Freq: Every day | ORAL | 1 refills | Status: DC

## 2016-06-11 NOTE — Progress Notes (Signed)
Subjective:    CC: HTN, ADD  HPI:  Hypertension- Pt denies chest pain, SOB, dizziness, or heart palpitations.  Taking meds as directed w/o problems.  Denies medication side effects.    ADD - Reports symptoms are well controlled on current regime. Denies any problems with insomnia, chest pain, palpitations, or SOB.     BMI 35 - he is down 10 lbs.   Is been working hard on his diet. His work has also picked up and he is very physically active at work. His wife is been doing Weight Watchers which means all family has been eating differently.  Past medical history, Surgical history, Family history not pertinant except as noted below, Social history, Allergies, and medications have been entered into the medical record, reviewed, and corrections made.   Review of Systems: No fevers, chills, night sweats, weight loss, chest pain, or shortness of breath.   Objective:    General: Well Developed, well nourished, and in no acute distress.  Neuro: Alert and oriented x3, extra-ocular muscles intact, sensation grossly intact.  HEENT: Normocephalic, atraumatic  Skin: Warm and dry, no rashes. Cardiac: Regular rate and rhythm, no murmurs rubs or gallops, no lower extremity edema.  Respiratory: Clear to auscultation bilaterally. Not using accessory muscles, speaking in full sentences.   Impression and Recommendations:    HTN - Well controlled. Continue current regimen. Follow up in  6months. Pressure has been trending downward which is fantastic. It looks perfect today. If it continues to lower as he loses weight that we might even be able to decrease his blood pressure pill.  ADD - well controlled. Continue current regimen. 90 day prescription given. Follow-up in 4 months.  BMI down to 35 which is fantastic. Continue work on [employer] and regular exercise. His goal would be to get under 200 pounds so he still needs to lose about another 30 pounds to get there.

## 2016-10-13 ENCOUNTER — Ambulatory Visit (INDEPENDENT_AMBULATORY_CARE_PROVIDER_SITE_OTHER): Payer: BLUE CROSS/BLUE SHIELD | Admitting: Family Medicine

## 2016-10-13 ENCOUNTER — Encounter: Payer: Self-pay | Admitting: Family Medicine

## 2016-10-13 ENCOUNTER — Other Ambulatory Visit: Payer: Self-pay

## 2016-10-13 VITALS — BP 126/80 | HR 66 | Ht 68.0 in | Wt 235.0 lb

## 2016-10-13 DIAGNOSIS — F988 Other specified behavioral and emotional disorders with onset usually occurring in childhood and adolescence: Secondary | ICD-10-CM

## 2016-10-13 DIAGNOSIS — I10 Essential (primary) hypertension: Secondary | ICD-10-CM | POA: Diagnosis not present

## 2016-10-13 DIAGNOSIS — E785 Hyperlipidemia, unspecified: Secondary | ICD-10-CM | POA: Diagnosis not present

## 2016-10-13 MED ORDER — AMPHETAMINE-DEXTROAMPHETAMINE 10 MG PO TABS: 10.0000 mg | ORAL_TABLET | Freq: Two times a day (BID) | ORAL | 0 refills | Status: DC

## 2016-10-13 MED ORDER — LOSARTAN POTASSIUM 100 MG PO TABS: 100.0000 mg | ORAL_TABLET | Freq: Every day | ORAL | 0 refills | Status: DC

## 2016-10-13 NOTE — Progress Notes (Signed)
Subjective:    CC: HTN, ADD  HPI:  Hypertension- Pt denies chest pain, SOB, dizziness, or heart palpitations.  Taking meds as directed w/o problems.  Denies medication side effects.     F/U ADD - 4 months - Currently on Aderral 10mg   BID. Tolerating well without any side effects. No chest pain shortness of breath or palpitations. No insomnia. Happy with current regimen.  Past medical history, Surgical history, Family history not pertinant except as noted below, Social history, Allergies, and medications have been entered into the medical record, reviewed, and corrections made.   Review of Systems: No fevers, chills, night sweats, weight loss, chest pain, or shortness of breath.   Objective:    General: Well Developed, well nourished, and in no acute distress.  Neuro: Alert and oriented x3, extra-ocular muscles intact, sensation grossly intact.  HEENT: Normocephalic, atraumatic  Skin: Warm and dry, no rashes. Cardiac: Regular rate and rhythm, no murmurs rubs or gallops, no lower extremity edema.  Respiratory: Clear to auscultation bilaterally. Not using accessory muscles, speaking in full sentences.   Impression and Recommendations:    ADD- Well-controlled. Continue current regimen. Follo4 months. The pressure at goal today. Due for labs.   HTN - Well controlled. Continue current regimen. Follow up in  6 months.

## 2016-10-13 NOTE — Progress Notes (Signed)
Pt was late for OV today, sent in one month supply of BP Rx to last until rescheduled appointment.

## 2016-12-25 ENCOUNTER — Other Ambulatory Visit: Payer: Self-pay | Admitting: Family Medicine

## 2017-02-15 ENCOUNTER — Ambulatory Visit: Payer: BLUE CROSS/BLUE SHIELD | Admitting: Family Medicine

## 2017-02-18 ENCOUNTER — Ambulatory Visit: Payer: BLUE CROSS/BLUE SHIELD | Admitting: Family Medicine

## 2017-03-02 ENCOUNTER — Encounter: Payer: Self-pay | Admitting: Family Medicine

## 2017-03-02 ENCOUNTER — Ambulatory Visit (INDEPENDENT_AMBULATORY_CARE_PROVIDER_SITE_OTHER): Payer: BLUE CROSS/BLUE SHIELD | Admitting: Family Medicine

## 2017-03-02 VITALS — BP 136/82 | HR 75 | Ht 68.0 in | Wt 237.0 lb

## 2017-03-02 DIAGNOSIS — F988 Other specified behavioral and emotional disorders with onset usually occurring in childhood and adolescence: Secondary | ICD-10-CM | POA: Diagnosis not present

## 2017-03-02 DIAGNOSIS — E785 Hyperlipidemia, unspecified: Secondary | ICD-10-CM

## 2017-03-02 DIAGNOSIS — S8992XA Unspecified injury of left lower leg, initial encounter: Secondary | ICD-10-CM

## 2017-03-02 DIAGNOSIS — I10 Essential (primary) hypertension: Secondary | ICD-10-CM

## 2017-03-02 MED ORDER — AMPHETAMINE-DEXTROAMPHETAMINE 10 MG PO TABS
10.0000 mg | ORAL_TABLET | Freq: Two times a day (BID) | ORAL | 0 refills | Status: DC
Start: 2017-03-02 — End: 2018-11-28

## 2017-03-02 MED ORDER — AMPHETAMINE-DEXTROAMPHETAMINE 10 MG PO TABS: 10.0000 mg | ORAL_TABLET | Freq: Two times a day (BID) | ORAL | 0 refills | Status: DC

## 2017-03-02 MED ORDER — LOSARTAN POTASSIUM 100 MG PO TABS: 100.0000 mg | ORAL_TABLET | Freq: Every day | ORAL | 1 refills | Status: DC

## 2017-03-02 NOTE — Progress Notes (Signed)
Subjective:    CC:   HPI: Hypertension- Pt denies chest pain, SOB, dizziness, or heart palpitations.  Taking meds as directed w/o problems.  Denies medication side effects.    F/U ADD -no chest pain or shortness of breath.  No palpitations or insomnia.  He is happy with his current regimen.  Hyperlipidemia-we had ordered repeat labs in September but we have not seen the results yet.  He did want to let me know that he is been having pain and problems in his left knee.  He has been seen by [employer] over the last couple weeks and it does seem to be getting a little better.  He is been wearing a brace at work and has a follow-up appointment in about 2 weeks with them.  Past medical history, Surgical history, Family history not pertinant except as noted below, Social history, Allergies, and medications have been entered into the medical record, reviewed, and corrections made.   Review of Systems: No fevers, chills, night sweats, weight loss, chest pain, or shortness of breath.   Objective:    General: Well Developed, well nourished, and in no acute distress.  Neuro: Alert and oriented x3, extra-ocular muscles intact, sensation grossly intact.  HEENT: Normocephalic, atraumatic  Skin: Warm and dry, no rashes. Cardiac: Regular rate and rhythm, no murmurs rubs or gallops, no lower extremity edema.  Respiratory: Clear to auscultation bilaterally. Not using accessory muscles, speaking in full sentences.   Impression and Recommendations:    HTN - Well controlled. Continue current regimen. Follow up in  6 months.  Reminded him to go for his labs.  Reprinted them today.  ADD -table.  Continue current regimen.  Refills sent for the next 3 months.  Hyperlipidemia-encouraged him to get labs drawn.  Lab slip reprinted.  Left knee injury-following with workers comp.

## 2017-04-06 DIAGNOSIS — M2392 Unspecified internal derangement of left knee: Secondary | ICD-10-CM | POA: Diagnosis not present

## 2017-04-06 DIAGNOSIS — M25562 Pain in left knee: Secondary | ICD-10-CM | POA: Diagnosis not present

## 2017-04-09 DIAGNOSIS — S83232A Complex tear of medial meniscus, current injury, left knee, initial encounter: Secondary | ICD-10-CM | POA: Diagnosis not present

## 2017-04-09 DIAGNOSIS — M25462 Effusion, left knee: Secondary | ICD-10-CM | POA: Diagnosis not present

## 2017-04-09 DIAGNOSIS — Z1389 Encounter for screening for other disorder: Secondary | ICD-10-CM | POA: Diagnosis not present

## 2017-04-09 DIAGNOSIS — M25562 Pain in left knee: Secondary | ICD-10-CM | POA: Diagnosis not present

## 2017-04-14 DIAGNOSIS — M25562 Pain in left knee: Secondary | ICD-10-CM | POA: Diagnosis not present

## 2017-04-27 DIAGNOSIS — Z6835 Body mass index (BMI) 35.0-35.9, adult: Secondary | ICD-10-CM | POA: Diagnosis not present

## 2017-04-27 DIAGNOSIS — Y929 Unspecified place or not applicable: Secondary | ICD-10-CM | POA: Diagnosis not present

## 2017-04-27 DIAGNOSIS — M2242 Chondromalacia patellae, left knee: Secondary | ICD-10-CM | POA: Diagnosis not present

## 2017-04-27 DIAGNOSIS — E669 Obesity, unspecified: Secondary | ICD-10-CM | POA: Diagnosis not present

## 2017-04-27 DIAGNOSIS — I10 Essential (primary) hypertension: Secondary | ICD-10-CM | POA: Diagnosis not present

## 2017-04-27 DIAGNOSIS — G8918 Other acute postprocedural pain: Secondary | ICD-10-CM | POA: Diagnosis not present

## 2017-04-27 DIAGNOSIS — S83232A Complex tear of medial meniscus, current injury, left knee, initial encounter: Secondary | ICD-10-CM | POA: Diagnosis not present

## 2017-04-27 DIAGNOSIS — S83242A Other tear of medial meniscus, current injury, left knee, initial encounter: Secondary | ICD-10-CM | POA: Diagnosis not present

## 2017-04-27 DIAGNOSIS — X58XXXA Exposure to other specified factors, initial encounter: Secondary | ICD-10-CM | POA: Diagnosis not present

## 2017-05-05 DIAGNOSIS — M2392 Unspecified internal derangement of left knee: Secondary | ICD-10-CM | POA: Diagnosis not present

## 2017-05-05 DIAGNOSIS — R6889 Other general symptoms and signs: Secondary | ICD-10-CM | POA: Diagnosis not present

## 2017-05-05 DIAGNOSIS — M25562 Pain in left knee: Secondary | ICD-10-CM | POA: Diagnosis not present

## 2017-05-20 DIAGNOSIS — R6889 Other general symptoms and signs: Secondary | ICD-10-CM | POA: Diagnosis not present

## 2017-05-20 DIAGNOSIS — M25562 Pain in left knee: Secondary | ICD-10-CM | POA: Diagnosis not present

## 2017-06-30 ENCOUNTER — Ambulatory Visit: Payer: BLUE CROSS/BLUE SHIELD | Admitting: Family Medicine

## 2017-06-30 NOTE — Progress Notes (Deleted)
Subjective:    CC: ADD  HPI:  Hypertension- Pt denies chest pain, SOB, dizziness, or heart palpitations.  Taking meds as directed w/o problems.  Denies medication side effects.    F/U ADD -   Past medical history, Surgical history, Family history not pertinant except as noted below, Social history, Allergies, and medications have been entered into the medical record, reviewed, and corrections made.   Review of Systems: No fevers, chills, night sweats, weight loss, chest pain, or shortness of breath.   Objective:    General: Well Developed, well nourished, and in no acute distress.  Neuro: Alert and oriented x3, extra-ocular muscles intact, sensation grossly intact.  HEENT: Normocephalic, atraumatic  Skin: Warm and dry, no rashes. Cardiac: Regular rate and rhythm, no murmurs rubs or gallops, no lower extremity edema.  Respiratory: Clear to auscultation bilaterally. Not using accessory muscles, speaking in full sentences.   Impression and Recommendations:    HTN -   ADD -

## 2017-08-03 ENCOUNTER — Telehealth: Payer: Self-pay | Admitting: Family Medicine

## 2017-08-03 DIAGNOSIS — I10 Essential (primary) hypertension: Secondary | ICD-10-CM

## 2017-08-03 DIAGNOSIS — E785 Hyperlipidemia, unspecified: Secondary | ICD-10-CM

## 2017-08-03 NOTE — Telephone Encounter (Signed)
Please call patient and let him know that he needs to go for his blood work.  It was ordered last fall and it looks like he never went.  With the prescriptions that he currently takes his kidney function has to be monitored.  We can reorder a CMP and a lipid panel if needed.

## 2017-08-04 ENCOUNTER — Other Ambulatory Visit: Payer: Self-pay | Admitting: *Deleted

## 2017-08-04 DIAGNOSIS — I10 Essential (primary) hypertension: Secondary | ICD-10-CM

## 2017-08-04 DIAGNOSIS — E785 Hyperlipidemia, unspecified: Secondary | ICD-10-CM

## 2017-08-04 NOTE — Telephone Encounter (Signed)
Called and advised pt of recommendations he stated that he would try to get by today.Maryruth Eve, Lahoma Crocker, CMA

## 2017-11-23 DIAGNOSIS — I10 Essential (primary) hypertension: Secondary | ICD-10-CM | POA: Diagnosis not present

## 2017-11-23 DIAGNOSIS — E785 Hyperlipidemia, unspecified: Secondary | ICD-10-CM | POA: Diagnosis not present

## 2017-11-23 LAB — COMPLETE METABOLIC PANEL WITH GFR
AG RATIO: 1.4 (calc) (ref 1.0–2.5)
ALT: 44 U/L (ref 9–46)
AST: 31 U/L (ref 10–40)
Albumin: 4.3 g/dL (ref 3.6–5.1)
Alkaline phosphatase (APISO): 49 U/L (ref 40–115)
BILIRUBIN TOTAL: 0.6 mg/dL (ref 0.2–1.2)
BUN: 19 mg/dL (ref 7–25)
CALCIUM: 10.1 mg/dL (ref 8.6–10.3)
CO2: 24 mmol/L (ref 20–32)
Chloride: 106 mmol/L (ref 98–110)
Creat: 1.09 mg/dL (ref 0.60–1.35)
GFR, EST AFRICAN AMERICAN: 92 mL/min/{1.73_m2} (ref >=60)
GFR, EST NON AFRICAN AMERICAN: 79 mL/min/{1.73_m2} (ref >=60)
Globulin: 3.1 g/dL (calc) (ref 1.9–3.7)
Glucose, Bld: 103 mg/dL — ABNORMAL HIGH (ref 65–99)
POTASSIUM: 4.4 mmol/L (ref 3.5–5.3)
Sodium: 139 mmol/L (ref 135–146)
TOTAL PROTEIN: 7.4 g/dL (ref 6.1–8.1)

## 2017-11-23 LAB — LIPID PANEL
CHOL/HDL RATIO: 5.2 (calc) — AB (ref <5.0)
CHOLESTEROL: 222 mg/dL — AB (ref <200)
HDL: 43 mg/dL (ref >40)
LDL Cholesterol (Calc): 132 mg/dL (calc) — ABNORMAL HIGH
NON-HDL CHOLESTEROL (CALC): 179 mg/dL — AB (ref <130)
TRIGLYCERIDES: 330 mg/dL — AB (ref <150)

## 2017-11-24 ENCOUNTER — Other Ambulatory Visit: Payer: Self-pay

## 2017-11-24 DIAGNOSIS — I10 Essential (primary) hypertension: Secondary | ICD-10-CM

## 2017-11-24 MED ORDER — CELECOXIB 100 MG PO CAPS: 100.0000 mg | ORAL_CAPSULE | Freq: Two times a day (BID) | ORAL | 5 refills | Status: DC | PRN

## 2017-11-24 MED ORDER — LOSARTAN POTASSIUM 100 MG PO TABS: 100.0000 mg | ORAL_TABLET | Freq: Every day | ORAL | 1 refills | Status: DC

## 2018-06-20 ENCOUNTER — Other Ambulatory Visit: Payer: Self-pay | Admitting: Family Medicine

## 2018-06-20 DIAGNOSIS — I10 Essential (primary) hypertension: Secondary | ICD-10-CM

## 2018-07-27 ENCOUNTER — Telehealth: Payer: Self-pay

## 2018-07-27 NOTE — Telephone Encounter (Signed)
Jesus Kennedy called and left a message that he needed to schedule a follow up. I called and left a message for patient to call the scheduling line to schedule an appointment.

## 2018-07-28 ENCOUNTER — Encounter: Payer: Self-pay | Admitting: Family Medicine

## 2018-07-28 ENCOUNTER — Ambulatory Visit (INDEPENDENT_AMBULATORY_CARE_PROVIDER_SITE_OTHER): Payer: Managed Care, Other (non HMO) | Admitting: Family Medicine

## 2018-07-28 ENCOUNTER — Other Ambulatory Visit: Payer: Self-pay

## 2018-07-28 VITALS — BP 134/84 | HR 68 | Ht 68.0 in | Wt 249.0 lb

## 2018-07-28 DIAGNOSIS — E785 Hyperlipidemia, unspecified: Secondary | ICD-10-CM

## 2018-07-28 DIAGNOSIS — I10 Essential (primary) hypertension: Secondary | ICD-10-CM

## 2018-07-28 DIAGNOSIS — F988 Other specified behavioral and emotional disorders with onset usually occurring in childhood and adolescence: Secondary | ICD-10-CM | POA: Diagnosis not present

## 2018-07-28 DIAGNOSIS — Z125 Encounter for screening for malignant neoplasm of prostate: Secondary | ICD-10-CM | POA: Diagnosis not present

## 2018-07-28 DIAGNOSIS — Z566 Other physical and mental strain related to work: Secondary | ICD-10-CM | POA: Insufficient documentation

## 2018-07-28 MED ORDER — LOSARTAN POTASSIUM 100 MG PO TABS: 100.0000 mg | ORAL_TABLET | Freq: Every day | ORAL | 2 refills | Status: DC

## 2018-07-28 NOTE — Assessment & Plan Note (Signed)
Pressure is borderline today.  Like to see the systolic under 854.  Discussed importance of working on weight loss and cutting back.  Also encouraged him to try to start doing some form of exercise and that he has concerns with his knee but I would really like to see him get his aerobic activity.  Plan to follow-up in 6 months.

## 2018-07-28 NOTE — Progress Notes (Signed)
Established Patient Office Visit  Subjective:  Patient ID: Jesus Kennedy, male    DOB: Mar 16, 1965  Age: 52 y.o. MRN: [id number]  CC:  Chief Complaint  Patient presents with  . Hypertension  . ADD    HPI EDNA GROVER presents for follow-up for several medical problems.  He did not have health insurance for a while as he recently changed jobs.  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  He did have knee surgery on his left knee back in April 2019 he had a significant meniscal tear and had that repaired.  He says that seems to be healing well.  Since he is had a more sedentary job lately though he has gained some weight.  Is also been under a little bit more stress recently as his wife has been furloughed from work.  It has created a little bit more stress than his previous job and so sometimes it makes it more difficult for him to of sleep.  He says when he wakes up he just feels like his mind is spinning.  ADD - Reports symptoms are well controlled on current regime. Denies any problems with insomnia, chest pain, palpitations, or SOB.  He is actually not filled his medication in quite some time.  Hypertension- Pt denies chest pain, SOB, dizziness, or heart palpitations.  Taking meds as directed w/o problems.  Denies medication side effects.    Hyperlipidemia-mild elevation in lipids last fall.  Not currently on prescription medication for treatment.     Past Medical History:  Diagnosis Date  . Hypertension     Past Surgical History:  Procedure Laterality Date  . APPENDECTOMY  2011    Family History  Problem Relation Age of Onset  . Depression Mother   . Hyperlipidemia Mother   . Hypertension Mother        father  . COPD Mother        smoker  . Hypothyroidism Father   . Hypothyroidism Brother   . Heart defect Daughter        Tetralogy of Fallot     Social History   Socioeconomic History  . Marital status: Married     Spouse name: Diane  . Number of children: 1  . Years of education: Not on file  . Highest education level: Not on file  Occupational History  . Occupation: Careers information officer    Comment: [employer]  . Financial resource strain: Not on file  . Food insecurity    Worry: Not on file    Inability: Not on file  . Transportation needs    Medical: Not on file    Non-medical: Not on file  Tobacco Use  . Smoking status: Never Smoker  . Smokeless tobacco: Never Used  Substance and Sexual Activity  . Alcohol use: No  . Drug use: No  . Sexual activity: Yes    Partners: Female  Lifestyle  . Physical activity    Days per week: 0 days    Minutes per session: Not on file  . Stress: Not on file  Relationships  . Social Herbalist on phone: Not on file    Gets together: Not on file    Attends religious service: Not on file    Active member of club or organization: Not on file    Attends meetings of clubs or organizations: Not on file    Relationship status: Not  on file  . Intimate partner violence    Fear of current or ex partner: Not on file    Emotionally abused: Not on file    Physically abused: Not on file    Forced sexual activity: Not on file  Other Topics Concern  . Not on file  Social History Narrative   No regular exercise but he does have a very physically active job.    Outpatient Medications Prior to Visit  Medication Sig Dispense Refill  . AMBULATORY NON FORMULARY MEDICATION Medication Name: focus factor    . amphetamine-dextroamphetamine (ADDERALL) 10 MG tablet Take 1 tablet (10 mg total) by mouth 2 (two) times daily. 60 tablet 0  . amphetamine-dextroamphetamine (ADDERALL) 10 MG tablet Take 1 tablet (10 mg total) by mouth 2 (two) times daily. 60 tablet 0  . amphetamine-dextroamphetamine (ADDERALL) 10 MG tablet Take 1 tablet (10 mg total) by mouth 2 (two) times daily. 60 tablet 0  . B Complex-Biotin-FA (SUPER B-COMPLEX PO) Take by mouth.     . celecoxib (CELEBREX) 100 MG capsule Take 1 capsule (100 mg total) by mouth 2 (two) times daily as needed. 60 capsule 5  . Coenzyme Q10 (CO Q 10 PO) Take by mouth.    . cyclobenzaprine (FLEXERIL) 10 MG tablet TAKE ONE TABLET BY MOUTH AT BEDTIME AS NEEDED FOR  MUSCLE  SPASMS 90 tablet 0  . Misc Natural Products (OSTEO BI-FLEX JOINT SHIELD PO) Take by mouth.    . Multiple Vitamin (MULTIVITAMIN) tablet Take 1 tablet by mouth daily.    . Omega-3 Fatty Acids (FISH OIL PO) Take by mouth daily.    [name] losartan (COZAAR) 100 MG tablet Take 1 tablet by mouth once daily 30 tablet 0   No facility-administered medications prior to visit.     Allergies  Allergen Reactions  . Penicillins Rash    ROS Review of Systems    Objective:    Physical Exam  BP 134/84   Pulse 68   Ht 5\' 8"  (1.727 m)   Wt 249 lb (112.9 kg)   SpO2 98%   BMI 37.86 kg/m  Wt Readings from Last 3 Encounters:  07/28/18 249 lb (112.9 kg)  03/02/17 237 lb (107.5 kg)  10/13/16 235 lb (106.6 kg)     Health Maintenance Due  Topic Date Due  . HIV Screening  11/29/1982  . COLONOSCOPY  11/28/2017    There are no preventive care reminders to display for this patient.  Lab Results  Component Value Date   TSH 4.14 08/05/2015   Lab Results  Component Value Date   WBC 5.9 08/05/2015   HGB 15.5 08/05/2015   HCT 45.1 08/05/2015   MCV 95.1 08/05/2015   PLT 155 08/05/2015   Lab Results  Component Value Date   NA 139 11/23/2017   K 4.4 11/23/2017   CO2 24 11/23/2017   GLUCOSE 103 (H) 11/23/2017   BUN 19 11/23/2017   CREATININE 1.09 11/23/2017   BILITOT 0.6 11/23/2017   ALKPHOS 43 08/05/2015   AST 31 11/23/2017   ALT 44 11/23/2017   PROT 7.4 11/23/2017   ALBUMIN 4.6 08/05/2015   CALCIUM 10.1 11/23/2017   Lab Results  Component Value Date   CHOL 222 (H) 11/23/2017   Lab Results  Component Value Date   HDL 43 11/23/2017   Lab Results  Component Value Date   LDLCALC 132 (H) 11/23/2017   Lab Results   Component Value Date   TRIG 330 (H) 11/23/2017  Lab Results  Component Value Date   CHOLHDL 5.2 (H) 11/23/2017   No results found for: HGBA1C    Assessment & Plan:   Problem List Items Addressed This Visit      Cardiovascular and Mediastinum   Essential hypertension, benign - Primary    Pressure is borderline today.  Like to see the systolic under 601.  Discussed importance of working on weight loss and cutting back.  Also encouraged him to try to start doing some form of exercise and that he has concerns with his knee but I would really like to see him get his aerobic activity.  Plan to follow-up in 6 months.      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  Also if at any point the stress gets a little overwhelming I be more than happy to talk with him about strategies around this.      Hyperlipidemia    Not currently on a statin but did encourage him to continue to work on diet and weight loss.      Relevant Medications   losartan (COZAAR) 100 MG tablet   ADD (attention deficit disorder)    We well overall right now he does not need refills he says he still has some the medication left.       Other Visit Diagnoses    Screening for prostate cancer       Relevant Orders   PSA      Now that he is 52 due for screening PSA and also due for colon cancer screening.  Discussed both recommendations.   Meds ordered this encounter  Medications  . losartan (COZAAR) 100 MG tablet    Sig: Take 1 tablet (100 mg total) by mouth daily.    Dispense:  90 tablet    Refill:  2    Follow-up: Return in about 4 months (around 11/28/2018) for Physical .    Beatrice Lecher, MD

## 2018-07-28 NOTE — Assessment & Plan Note (Signed)
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  Also if at any point the stress gets a little overwhelming I be more than happy to talk with him about strategies around this.

## 2018-07-28 NOTE — Assessment & Plan Note (Signed)
We well overall right now he does not need refills he says he still has some the medication left.

## 2018-07-28 NOTE — Assessment & Plan Note (Signed)
Not currently on a statin but did encourage him to continue to work on diet and weight loss.

## 2018-07-29 LAB — BASIC METABOLIC PANEL WITH GFR
BUN: 18 mg/dL (ref 7–25)
CO2: 25 mmol/L (ref 20–32)
Calcium: 9.9 mg/dL (ref 8.6–10.3)
Chloride: 103 mmol/L (ref 98–110)
Creat: 1.07 mg/dL (ref 0.70–1.33)
GFR, Est African American: 93 mL/min/{1.73_m2} (ref >=60)
GFR, Est Non African American: 81 mL/min/{1.73_m2} (ref >=60)
Glucose, Bld: 103 mg/dL — ABNORMAL HIGH (ref 65–99)
Potassium: 4.4 mmol/L (ref 3.5–5.3)
Sodium: 136 mmol/L (ref 135–146)

## 2018-07-29 LAB — PSA: PSA: 0.4 ng/mL (ref <=4.0)

## 2018-07-29 NOTE — Progress Notes (Signed)
All labs are normal. 

## 2018-11-28 ENCOUNTER — Other Ambulatory Visit: Payer: Self-pay

## 2018-11-28 ENCOUNTER — Encounter: Payer: Self-pay | Admitting: Gastroenterology

## 2018-11-28 ENCOUNTER — Encounter: Payer: Self-pay | Admitting: Family Medicine

## 2018-11-28 ENCOUNTER — Ambulatory Visit (INDEPENDENT_AMBULATORY_CARE_PROVIDER_SITE_OTHER): Payer: Managed Care, Other (non HMO) | Admitting: Family Medicine

## 2018-11-28 VITALS — BP 131/76 | HR 73 | Ht 68.0 in | Wt 253.0 lb

## 2018-11-28 DIAGNOSIS — Z1211 Encounter for screening for malignant neoplasm of colon: Secondary | ICD-10-CM | POA: Diagnosis not present

## 2018-11-28 DIAGNOSIS — Z0000 Encounter for general adult medical examination without abnormal findings: Secondary | ICD-10-CM

## 2018-11-28 DIAGNOSIS — F988 Other specified behavioral and emotional disorders with onset usually occurring in childhood and adolescence: Secondary | ICD-10-CM | POA: Diagnosis not present

## 2018-11-28 MED ORDER — AMPHETAMINE-DEXTROAMPHETAMINE 10 MG PO TABS: 10.0000 mg | ORAL_TABLET | Freq: Two times a day (BID) | ORAL | 0 refills | Status: DC

## 2018-11-28 NOTE — Patient Instructions (Signed)

## 2018-11-28 NOTE — Progress Notes (Signed)
CPE - Established Patient Office Visit  Subjective:  Patient ID: Jesus Kennedy, male    DOB: 17-Jul-1965  Age: 53 y.o. MRN: [id number]  CC:  Chief Complaint  Patient presents with  . Annual Exam    HPI Jesus Kennedy presents for CPE. He is doing well overeall. No regular exercise right now. Still having a lot of problems with his left knee since surgery. Also have diffuse joint pain esp in shoulder but hasn't been as active.    Past Medical History:  Diagnosis Date  . Hypertension     Past Surgical History:  Procedure Laterality Date  . APPENDECTOMY  2011    Family History  Problem Relation Age of Onset  . Depression Mother   . Hyperlipidemia Mother   . Hypertension Mother        father  . COPD Mother        smoker  . Hypothyroidism Father   . Hypothyroidism Brother   . Heart defect Daughter        Tetralogy of Fallot     Social History   Socioeconomic History  . Marital status: Married    Spouse name: Diane  . Number of children: 1  . Years of education: Not on file  . Highest education level: Not on file  Occupational History  . Occupation: Careers information officer    Comment: [employer]  . Financial resource strain: Not on file  . Food insecurity    Worry: Not on file    Inability: Not on file  . Transportation needs    Medical: Not on file    Non-medical: Not on file  Tobacco Use  . Smoking status: Never Smoker  . Smokeless tobacco: Never Used  Substance and Sexual Activity  . Alcohol use: No  . Drug use: No  . Sexual activity: Yes    Partners: Female  Lifestyle  . Physical activity    Days per week: 0 days    Minutes per session: Not on file  . Stress: Not on file  Relationships  . Social Herbalist on phone: Not on file    Gets together: Not on file    Attends religious service: Not on file    Active member of club or organization: Not on file    Attends meetings of clubs or organizations: Not on  file    Relationship status: Not on file  . Intimate partner violence    Fear of current or ex partner: Not on file    Emotionally abused: Not on file    Physically abused: Not on file    Forced sexual activity: Not on file  Other Topics Concern  . Not on file  Social History Narrative   No regular exercise but he does have a very physically active job.    Outpatient Medications Prior to Visit  Medication Sig Dispense Refill  . AMBULATORY NON FORMULARY MEDICATION Take 2 tablets by mouth daily. Medication Name: focus factor     . B Complex-Biotin-FA (SUPER B-COMPLEX PO) Take by mouth.    . celecoxib (CELEBREX) 100 MG capsule Take 1 capsule (100 mg total) by mouth 2 (two) times daily as needed. 60 capsule 5  . Coenzyme Q10 (CO Q 10 PO) Take by mouth.    . cyclobenzaprine (FLEXERIL) 10 MG tablet TAKE ONE TABLET BY MOUTH AT BEDTIME AS NEEDED FOR  MUSCLE  SPASMS 90 tablet 0  . losartan (COZAAR) 100  MG tablet Take 1 tablet (100 mg total) by mouth daily. 90 tablet 2  . Misc Natural Products (OSTEO BI-FLEX JOINT SHIELD PO) Take by mouth.    . Multiple Vitamin (MULTIVITAMIN) tablet Take 1 tablet by mouth daily.    . Omega-3 Fatty Acids (FISH OIL PO) Take by mouth daily.    [name] amphetamine-dextroamphetamine (ADDERALL) 10 MG tablet Take 1 tablet (10 mg total) by mouth 2 (two) times daily. 60 tablet 0  . amphetamine-dextroamphetamine (ADDERALL) 10 MG tablet Take 1 tablet (10 mg total) by mouth 2 (two) times daily. 60 tablet 0  . amphetamine-dextroamphetamine (ADDERALL) 10 MG tablet Take 1 tablet (10 mg total) by mouth 2 (two) times daily. 60 tablet 0   No facility-administered medications prior to visit.     Allergies  Allergen Reactions  . Penicillins Rash    ROS Review of Systems    Objective:    Physical Exam  Constitutional: He is oriented to person, place, and time. He appears well-developed and well-nourished.  HENT:  Head: Normocephalic and atraumatic.  Right Ear: External ear  normal.  Left Ear: External ear normal.  Nose: Nose normal.  Mouth/Throat: Oropharynx is clear and moist.  Eyes: Pupils are equal, round, and reactive to light. Conjunctivae and EOM are normal.  Neck: Normal range of motion. Neck supple. No thyromegaly present.  Cardiovascular: Normal rate, regular rhythm, normal heart sounds and intact distal pulses.  Pulmonary/Chest: Effort normal and breath sounds normal.  Abdominal: Soft. Bowel sounds are normal. He exhibits no distension and no mass. There is no abdominal tenderness. There is no rebound and no guarding.  Musculoskeletal: Normal range of motion.  Lymphadenopathy:    He has no cervical adenopathy.  Neurological: He is alert and oriented to person, place, and time. He has normal reflexes.  Skin: Skin is warm and dry.  Psychiatric: He has a normal mood and affect. His behavior is normal. Judgment and thought content normal.    BP 131/76   Pulse 73   Ht 5\' 8"  (1.727 m)   Wt 253 lb (114.8 kg)   SpO2 99%   BMI 38.47 kg/m  Wt Readings from Last 3 Encounters:  11/28/18 253 lb (114.8 kg)  07/28/18 249 lb (112.9 kg)  03/02/17 237 lb (107.5 kg)     Health Maintenance Due  Topic Date Due  . HIV Screening  11/29/1982  . COLONOSCOPY  11/28/2017  . INFLUENZA VACCINE  08/20/2018    There are no preventive care reminders to display for this patient.  Lab Results  Component Value Date   TSH 4.14 08/05/2015   Lab Results  Component Value Date   WBC 5.9 08/05/2015   HGB 15.5 08/05/2015   HCT 45.1 08/05/2015   MCV 95.1 08/05/2015   PLT 155 08/05/2015   Lab Results  Component Value Date   NA 136 07/28/2018   K 4.4 07/28/2018   CO2 25 07/28/2018   GLUCOSE 103 (H) 07/28/2018   BUN 18 07/28/2018   CREATININE 1.07 07/28/2018   BILITOT 0.6 11/23/2017   ALKPHOS 43 08/05/2015   AST 31 11/23/2017   ALT 44 11/23/2017   PROT 7.4 11/23/2017   ALBUMIN 4.6 08/05/2015   CALCIUM 9.9 07/28/2018   Lab Results  Component Value Date    CHOL 222 (H) 11/23/2017   Lab Results  Component Value Date   HDL 43 11/23/2017   Lab Results  Component Value Date   LDLCALC 132 (H) 11/23/2017   Lab Results  Component Value Date   TRIG 330 (H) 11/23/2017   Lab Results  Component Value Date   CHOLHDL 5.2 (H) 11/23/2017   No results found for: HGBA1C    Assessment & Plan:   Problem List Items Addressed This Visit      Other   ADD (attention deficit disorder)   Relevant Medications   amphetamine-dextroamphetamine (ADDERALL) 10 MG tablet (Start on 01/26/2019)   amphetamine-dextroamphetamine (ADDERALL) 10 MG tablet (Start on 12/27/2018)   amphetamine-dextroamphetamine (ADDERALL) 10 MG tablet    Other Visit Diagnoses    Routine general medical examination at a health care facility    -  Primary   Relevant Orders   COMPLETE METABOLIC PANEL WITH GFR   Lipid Panel w/reflex Direct LDL   PSA   Screening for malignant neoplasm of colon       Relevant Orders   Ambulatory referral to Gastroenterology     Keep up a regular exercise program and make sure you are eating a healthy diet Try to eat 4 servings of dairy a day, or if you are lactose intolerant take a calcium with vitamin D daily.  Your vaccines are up to date.   In regards to his knee pain particularly the left knee pain and joint pain encouraged him to follow-up with Dr. Dianah Field.  He is interested in possible PRP.   Meds ordered this encounter  Medications  . amphetamine-dextroamphetamine (ADDERALL) 10 MG tablet    Sig: Take 1 tablet (10 mg total) by mouth 2 (two) times daily.    Dispense:  60 tablet    Refill:  0  . amphetamine-dextroamphetamine (ADDERALL) 10 MG tablet    Sig: Take 1 tablet (10 mg total) by mouth 2 (two) times daily.    Dispense:  60 tablet    Refill:  0  . amphetamine-dextroamphetamine (ADDERALL) 10 MG tablet    Sig: Take 1 tablet (10 mg total) by mouth 2 (two) times daily.    Dispense:  60 tablet    Refill:  0    Follow-up:  Return in about 6 months (around 05/28/2019) for adderall.    Beatrice Lecher, MD

## 2018-11-29 LAB — COMPLETE METABOLIC PANEL WITH GFR
AG Ratio: 1.6 (calc) (ref 1.0–2.5)
ALT: 73 U/L — ABNORMAL HIGH (ref 9–46)
AST: 45 U/L — ABNORMAL HIGH (ref 10–35)
Albumin: 4.5 g/dL (ref 3.6–5.1)
Alkaline phosphatase (APISO): 46 U/L (ref 35–144)
BUN: 18 mg/dL (ref 7–25)
CO2: 22 mmol/L (ref 20–32)
Calcium: 9.7 mg/dL (ref 8.6–10.3)
Chloride: 104 mmol/L (ref 98–110)
Creat: 1.06 mg/dL (ref 0.70–1.33)
GFR, Est African American: 94 mL/min/{1.73_m2} (ref >=60)
GFR, Est Non African American: 81 mL/min/{1.73_m2} (ref >=60)
Globulin: 2.8 g/dL (calc) (ref 1.9–3.7)
Glucose, Bld: 102 mg/dL — ABNORMAL HIGH (ref 65–99)
Potassium: 4.7 mmol/L (ref 3.5–5.3)
Sodium: 138 mmol/L (ref 135–146)
Total Bilirubin: 0.9 mg/dL (ref 0.2–1.2)
Total Protein: 7.3 g/dL (ref 6.1–8.1)

## 2018-11-29 LAB — LIPID PANEL W/REFLEX DIRECT LDL
Cholesterol: 221 mg/dL — ABNORMAL HIGH (ref <200)
HDL: 51 mg/dL (ref >=40)
LDL Cholesterol (Calc): 142 mg/dL (calc) — ABNORMAL HIGH
Non-HDL Cholesterol (Calc): 170 mg/dL (calc) — ABNORMAL HIGH (ref <130)
Total CHOL/HDL Ratio: 4.3 (calc) (ref <5.0)
Triglycerides: 149 mg/dL (ref <150)

## 2018-11-29 LAB — PSA: PSA: 0.4 ng/mL (ref <=4.0)

## 2018-12-02 ENCOUNTER — Other Ambulatory Visit: Payer: Self-pay | Admitting: *Deleted

## 2018-12-02 DIAGNOSIS — R748 Abnormal levels of other serum enzymes: Secondary | ICD-10-CM

## 2018-12-05 ENCOUNTER — Ambulatory Visit (AMBULATORY_SURGERY_CENTER): Payer: Managed Care, Other (non HMO) | Admitting: *Deleted

## 2018-12-05 ENCOUNTER — Encounter: Payer: Self-pay | Admitting: Gastroenterology

## 2018-12-05 ENCOUNTER — Other Ambulatory Visit: Payer: Self-pay

## 2018-12-05 VITALS — Temp 97.3°F | Ht 68.0 in | Wt 254.2 lb

## 2018-12-05 DIAGNOSIS — Z1159 Encounter for screening for other viral diseases: Secondary | ICD-10-CM

## 2018-12-05 DIAGNOSIS — Z1211 Encounter for screening for malignant neoplasm of colon: Secondary | ICD-10-CM

## 2018-12-05 MED ORDER — NA SULFATE-K SULFATE-MG SULF 17.5-3.13-1.6 GM/177ML PO SOLN: 1.0000 | Freq: Once | ORAL | 0 refills | Status: AC

## 2018-12-05 NOTE — Progress Notes (Signed)
No egg or soy allergy known to patient  No issues with past sedation with any surgeries  or procedures, no intubation problems  No diet pills per patient No home 02 use per patient  No blood thinners per patient  Pt denies issues with constipation  No A fib or A flutter  EMMI video sent to pt's e mail   Due to the COVID-19 pandemic we are asking patients to follow these guidelines. Please only bring one care partner. Please be aware that your care partner may wait in the car in the parking lot or if they feel like they will be too hot to wait in the car, they may wait in the lobby on the 4th floor. All care partners are required to wear a mask the entire time (we do not have any that we can provide them), they need to practice social distancing, and we will do a Covid check for all patient's and care partners when you arrive. Also we will check their temperature and your temperature. If the care partner waits in their car they need to stay in the parking lot the entire time and we will call them on their cell phone when the patient is ready for discharge so they can bring the car to the front of the building. Also all patient's will need to wear a mask into building.  COVID SCREENING 12/07/18, 12:20 PM  SUPREP COUPON PROVIDED.

## 2018-12-07 ENCOUNTER — Ambulatory Visit (INDEPENDENT_AMBULATORY_CARE_PROVIDER_SITE_OTHER): Payer: Managed Care, Other (non HMO)

## 2018-12-07 DIAGNOSIS — Z1159 Encounter for screening for other viral diseases: Secondary | ICD-10-CM

## 2018-12-08 LAB — SARS CORONAVIRUS 2 (TAT 6-24 HRS): SARS Coronavirus 2: NEGATIVE

## 2018-12-12 ENCOUNTER — Other Ambulatory Visit: Payer: Self-pay

## 2018-12-12 ENCOUNTER — Ambulatory Visit (AMBULATORY_SURGERY_CENTER): Payer: Managed Care, Other (non HMO) | Admitting: Gastroenterology

## 2018-12-12 ENCOUNTER — Encounter: Payer: Self-pay | Admitting: Gastroenterology

## 2018-12-12 VITALS — BP 134/72 | HR 84 | Temp 97.4°F | Resp 16 | Ht 68.0 in | Wt 254.2 lb

## 2018-12-12 DIAGNOSIS — Z1211 Encounter for screening for malignant neoplasm of colon: Secondary | ICD-10-CM

## 2018-12-12 DIAGNOSIS — D124 Benign neoplasm of descending colon: Secondary | ICD-10-CM

## 2018-12-12 MED ORDER — SODIUM CHLORIDE 0.9 % IV SOLN: 500.0000 mL | Freq: Once | INTRAVENOUS | Status: DC

## 2018-12-12 NOTE — Patient Instructions (Signed)
Please read handouts provided. Continue present medications. Await pathology results.        YOU HAD AN ENDOSCOPIC PROCEDURE TODAY AT THE [facility]:   Refer to the procedure report that was given to you for any specific questions about what was found during the examination.  If the procedure report does not answer your questions, please call your gastroenterologist to clarify.  If you requested that your care partner not be given the details of your procedure findings, then the procedure report has been included in a sealed envelope for you to review at your convenience later.  YOU SHOULD EXPECT: Some feelings of bloating in the abdomen. Passage of more gas than usual.  Walking can help get rid of the air that was put into your GI tract during the procedure and reduce the bloating. If you had a lower endoscopy (such as a colonoscopy or flexible sigmoidoscopy) you may notice spotting of blood in your stool or on the toilet paper. If you underwent a bowel prep for your procedure, you may not have a normal bowel movement for a few days.  Please Note:  You might notice some irritation and congestion in your nose or some drainage.  This is from the oxygen used during your procedure.  There is no need for concern and it should clear up in a day or so.  SYMPTOMS TO REPORT IMMEDIATELY:   Following lower endoscopy (colonoscopy or flexible sigmoidoscopy):  Excessive amounts of blood in the stool  Significant tenderness or worsening of abdominal pains  Swelling of the abdomen that is new, acute  Fever of 100F or higher    For urgent or emergent issues, a gastroenterologist can be reached at any hour by calling [phone].   DIET:  We do recommend a small meal at first, but then you may proceed to your regular diet.  Drink plenty of fluids but you should avoid alcoholic beverages for 24 hours.  ACTIVITY:  You should plan to take it easy for the rest of today and you should NOT  DRIVE or use heavy machinery until tomorrow (because of the sedation medicines used during the test).    FOLLOW UP: Our staff will call the number listed on your records 48-72 hours following your procedure to check on you and address any questions or concerns that you may have regarding the information given to you following your procedure. If we do not reach you, we will leave a message.  We will attempt to reach you two times.  During this call, we will ask if you have developed any symptoms of COVID 19. If you develop any symptoms (ie: fever, flu-like symptoms, shortness of breath, cough etc.) before then, please call [phone].  If you test positive for Covid 19 in the 2 weeks post procedure, please call and report this information to us.    If any biopsies were taken you will be contacted by phone or by letter within the next 1-3 weeks.  Please call us at [phone] if you have not heard about the biopsies in 3 weeks.    SIGNATURES/CONFIDENTIALITY: You and/or your care partner have signed paperwork which will be entered into your electronic medical record.  These signatures attest to the fact that that the information above on your After Visit Summary has been reviewed and is understood.  Full responsibility of the confidentiality of this discharge information lies with you and/or your care-partner. 

## 2018-12-12 NOTE — Op Note (Signed)
[place] Patient Name: Jesus Kennedy Procedure Date: 12/12/2018 8:12 AM MRN: [id number] Endoscopist: Remo Lipps P. Havery Moros , MD Age: 53 Referring MD:  Date of Birth: 02/25/1965 Gender: Male Account #: [id number] Procedure:                Colonoscopy Indications:              Screening for colorectal malignant neoplasm Medicines:                Monitored Anesthesia Care Procedure:                Pre-Anesthesia Assessment:                           - Prior to the procedure, a History and Physical                            was performed, and patient medications and                            allergies were reviewed. The patient's tolerance of                            previous anesthesia was also reviewed. The risks                            and benefits of the procedure and the sedation                            options and risks were discussed with the patient.                            All questions were answered, and informed consent                            was obtained. Prior Anticoagulants: The patient has                            taken no previous anticoagulant or antiplatelet                            agents. ASA Grade Assessment: II - A patient with                            mild systemic disease. After reviewing the risks                            and benefits, the patient was deemed in                            satisfactory condition to undergo the procedure.                           After obtaining informed consent, the colonoscope  was passed under direct vision. Throughout the                            procedure, the patient's blood pressure, pulse, and                            oxygen saturations were monitored continuously. The                            Colonoscope was introduced through the anus and                            advanced to the the cecum, identified by                            appendiceal  orifice and ileocecal valve. The                            colonoscopy was performed without difficulty. The                            patient tolerated the procedure well. The quality                            of the bowel preparation was good. The ileocecal                            valve, appendiceal orifice, and rectum were                            photographed. Scope In: 8:15:38 AM Scope Out: 8:32:48 AM Scope Withdrawal Time: 0 hours 13 minutes 41 seconds  Total Procedure Duration: 0 hours 17 minutes 10 seconds  Findings:                 The perianal and digital rectal examinations were                            normal.                           A possible diminutive polyp was found in the                            appendiceal orifice. The polyp was flat. The polyp                            was removed with a cold biopsy forceps. Resection                            and retrieval were complete.                           A diminutive polyp was found in the descending  colon. The polyp was sessile. The polyp was removed                            with a cold biopsy forceps. Resection and retrieval                            were complete.                           A few small-mouthed diverticula were found in the                            sigmoid colon.                           Internal hemorrhoids were found during                            retroflexion. The hemorrhoids were moderate.                           The exam was otherwise without abnormality. Complications:            No immediate complications. Estimated blood loss:                            Minimal. Estimated Blood Loss:     Estimated blood loss was minimal. Impression:               - One diminutive polyp possible polyp at the                            appendiceal orifice, removed with a cold biopsy                            forceps. Resected and retrieved.                            - One diminutive polyp in the descending colon,                            removed with a cold biopsy forceps. Resected and                            retrieved.                           - Diverticulosis in the sigmoid colon.                           - Internal hemorrhoids.                           - The examination was otherwise normal. Recommendation:           - Patient has a contact number available for  emergencies. The signs and symptoms of potential                            delayed complications were discussed with the                            patient. Return to normal activities tomorrow.                            Written discharge instructions were provided to the                            patient.                           - Resume previous diet.                           - Continue present medications.                           - Await pathology results. Remo Lipps P. Jesus Milstein, MD 12/12/2018 8:36:41 AM This report has been signed electronically.

## 2018-12-12 NOTE — Progress Notes (Signed)
VS- Courtney Washington Temperature- Georgette Shell  Pt's states no medical or surgical changes since previsit or office visit.

## 2018-12-12 NOTE — Progress Notes (Signed)
Pt tolerated well. VSS. To recovery. Stable. 

## 2018-12-14 ENCOUNTER — Telehealth: Payer: Self-pay

## 2018-12-14 NOTE — Telephone Encounter (Signed)
  Follow up Call-  Call back number 12/12/2018  Post procedure Call Back phone  # [phone]  Permission to leave phone message Yes  Some recent data might be hidden     Patient questions:  Do you have a fever, pain , or abdominal swelling? No. Pain Score  0 *  Have you tolerated food without any problems? Yes.    Have you been able to return to your normal activities? Yes.    Do you have any questions about your discharge instructions: Diet   No. Medications  No. Follow up visit  No.  Do you have questions or concerns about your Care? No.  Actions: * If pain score is 4 or above: No action needed, pain <4.  1. Have you developed a fever since your procedure? no  2.   Have you had an respiratory symptoms (SOB or cough) since your procedure? no  3.   Have you tested positive for COVID 19 since your procedure no  4.   Have you had any family members/close contacts diagnosed with the COVID 19 since your procedure?  no   If yes to any of these questions please route to Joylene John, RN and Alphonsa Gin, Therapist, sports.

## 2018-12-24 ENCOUNTER — Emergency Department (HOSPITAL_BASED_OUTPATIENT_CLINIC_OR_DEPARTMENT_OTHER): Payer: Managed Care, Other (non HMO)

## 2018-12-24 ENCOUNTER — Other Ambulatory Visit: Payer: Self-pay

## 2018-12-24 ENCOUNTER — Emergency Department (HOSPITAL_BASED_OUTPATIENT_CLINIC_OR_DEPARTMENT_OTHER)
Admission: EM | Admit: 2018-12-24 | Discharge: 2018-12-24 | Disposition: A | Discharge status: Home / Self Care | Payer: Managed Care, Other (non HMO) | Attending: Emergency Medicine | Admitting: Emergency Medicine

## 2018-12-24 DIAGNOSIS — R519 Headache, unspecified: Secondary | ICD-10-CM | POA: Insufficient documentation

## 2018-12-24 DIAGNOSIS — Y999 Unspecified external cause status: Secondary | ICD-10-CM | POA: Diagnosis not present

## 2018-12-24 DIAGNOSIS — Z20828 Contact with and (suspected) exposure to other viral communicable diseases: Secondary | ICD-10-CM | POA: Insufficient documentation

## 2018-12-24 DIAGNOSIS — I10 Essential (primary) hypertension: Secondary | ICD-10-CM | POA: Diagnosis not present

## 2018-12-24 DIAGNOSIS — S161XXA Strain of muscle, fascia and tendon at neck level, initial encounter: Secondary | ICD-10-CM | POA: Diagnosis not present

## 2018-12-24 DIAGNOSIS — Y92410 Unspecified street and highway as the place of occurrence of the external cause: Secondary | ICD-10-CM | POA: Diagnosis not present

## 2018-12-24 DIAGNOSIS — Z79899 Other long term (current) drug therapy: Secondary | ICD-10-CM | POA: Diagnosis not present

## 2018-12-24 DIAGNOSIS — Y9389 Activity, other specified: Secondary | ICD-10-CM | POA: Insufficient documentation

## 2018-12-24 DIAGNOSIS — S199XXA Unspecified injury of neck, initial encounter: Secondary | ICD-10-CM | POA: Diagnosis present

## 2018-12-24 DIAGNOSIS — Z20822 Contact with and (suspected) exposure to covid-19: Secondary | ICD-10-CM
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
## 2018-12-25 ENCOUNTER — Encounter: Payer: Self-pay | Admitting: Gastroenterology

## 2018-12-26 LAB — NOVEL CORONAVIRUS, NAA (HOSP ORDER, SEND-OUT TO REF LAB; TAT 18-24 HRS): SARS-CoV-2, NAA: NOT DETECTED

## 2019-02-07 ENCOUNTER — Other Ambulatory Visit: Payer: Self-pay | Admitting: Family Medicine

## 2019-02-10 ENCOUNTER — Telehealth: Payer: Self-pay

## 2019-02-10 NOTE — Telephone Encounter (Signed)
Jesus Kennedy called and states he has been exposed to COVID-19. His exposure was this past Wednesday. He reports no symptoms. I did give him the information for the [facility] testing site.

## 2019-02-13 ENCOUNTER — Ambulatory Visit: Payer: Managed Care, Other (non HMO) | Attending: Internal Medicine

## 2019-02-13 DIAGNOSIS — Z20822 Contact with and (suspected) exposure to covid-19: Secondary | ICD-10-CM

## 2019-02-14 LAB — NOVEL CORONAVIRUS, NAA: SARS-CoV-2, NAA: NOT DETECTED

## 2019-04-21 ENCOUNTER — Other Ambulatory Visit: Payer: Self-pay | Admitting: Family Medicine

## 2019-05-29 ENCOUNTER — Ambulatory Visit: Payer: Managed Care, Other (non HMO) | Admitting: Family Medicine

## 2019-05-29 ENCOUNTER — Encounter: Payer: Self-pay | Admitting: Family Medicine

## 2019-05-29 VITALS — BP 157/87 | HR 77 | Ht 68.0 in | Wt 256.0 lb

## 2019-05-29 DIAGNOSIS — R7309 Other abnormal glucose: Secondary | ICD-10-CM | POA: Diagnosis not present

## 2019-05-29 DIAGNOSIS — M2550 Pain in unspecified joint: Secondary | ICD-10-CM

## 2019-05-29 DIAGNOSIS — R748 Abnormal levels of other serum enzymes: Secondary | ICD-10-CM

## 2019-05-29 DIAGNOSIS — F988 Other specified behavioral and emotional disorders with onset usually occurring in childhood and adolescence: Secondary | ICD-10-CM

## 2019-05-29 DIAGNOSIS — I10 Essential (primary) hypertension: Secondary | ICD-10-CM | POA: Diagnosis not present

## 2019-05-29 MED ORDER — AMPHETAMINE-DEXTROAMPHETAMINE 10 MG PO TABS: 10.0000 mg | ORAL_TABLET | Freq: Two times a day (BID) | ORAL | 0 refills | Status: DC

## 2019-05-29 MED ORDER — CELECOXIB 100 MG PO CAPS: 100.0000 mg | ORAL_CAPSULE | Freq: Two times a day (BID) | ORAL | 1 refills | Status: DC

## 2019-05-29 NOTE — Assessment & Plan Note (Addendum)
BP not well controlled. Had salt diet last night.  Commend return for nurse visit in a couple of weeks to recheck we will adjust medication at that time if not well controlled.

## 2019-05-29 NOTE — Progress Notes (Addendum)
Established Patient Office Visit  Subjective:  Patient ID: Jesus Kennedy, male    DOB: 1966/01/05  Age: 54 y.o. MRN: [id number]  CC:  Chief Complaint  Patient presents with  . Hypertension  . ADD    HPI Jesus Kennedy presents for   Hypertension- Pt denies chest pain, SOB, dizziness, or heart palpitations.  Taking meds as directed w/o problems.  Denies medication side effects.    ADD - Reports symptoms are well controlled on current regime. Denies any problems with insomnia, chest pain, palpitations, or SOB.   He also like a refill on his Celebrex today for 90 days for his arthritis.  Past Medical History:  Diagnosis Date  . Arthritis    LEFT KNEE,R SHOULDER  . Hypertension     Past Surgical History:  Procedure Laterality Date  . APPENDECTOMY  2011  . COLONOSCOPY    . KNEE SURGERY     LEFT KNEE April 27 2017    Family History  Problem Relation Age of Onset  . Depression Mother   . Hyperlipidemia Mother   . Hypertension Mother        father  . COPD Mother        smoker  . Hypothyroidism Father   . Hypothyroidism Brother   . Heart defect Daughter        Tetralogy of Fallot   . Colon cancer Neg Hx   . Colon polyps Neg Hx   . Esophageal cancer Neg Hx   . Prostate cancer Neg Hx   . Rectal cancer Neg Hx   . Stomach cancer Neg Hx     Social History   Socioeconomic History  . Marital status: Married    Spouse name: Diane  . Number of children: 1  . Years of education: Not on file  . Highest education level: Not on file  Occupational History  . Occupation: Careers information officer    Comment: [employer]  Tobacco Use  . Smoking status: Never Smoker  . Smokeless tobacco: Never Used  Substance and Sexual Activity  . Alcohol use: Yes    Comment: 3-4 X A WEEK  . Drug use: No  . Sexual activity: Yes    Partners: Female  Other Topics Concern  . Not on file  Social History Narrative   No regular exercise but he does have a very physically  active job.   Social Determinants of Health   Financial Resource Strain:   . Difficulty of Paying Living Expenses:   Food Insecurity:   . Worried About Charity fundraiser in the Last Year:   . Arboriculturist in the Last Year:   Transportation Needs:   . Film/video editor (Medical):   [name] Lack of Transportation (Non-Medical):   Physical Activity: Unknown  . Days of Exercise per Week: 0 days  . Minutes of Exercise per Session: Not on file  Stress:   . Feeling of Stress :   Social Connections:   . Frequency of Communication with Friends and Family:   . Frequency of Social Gatherings with Friends and Family:   . Attends Religious Services:   . Active Member of Clubs or Organizations:   . Attends Archivist Meetings:   [name] Marital Status:   Intimate Partner Violence:   . Fear of Current or Ex-Partner:   . Emotionally Abused:   [name] Physically Abused:   . Sexually Abused:     Outpatient Medications Prior to Visit  Medication Sig Dispense Refill  . AMBULATORY NON FORMULARY MEDICATION Take 2 tablets by mouth daily. Medication Name: focus factor     . B Complex-Biotin-FA (SUPER B-COMPLEX PO) Take by mouth.    . Coenzyme Q10 (CO Q 10 PO) Take by mouth.    . losartan (COZAAR) 100 MG tablet Take 1 tablet (100 mg total) by mouth daily. 90 tablet 2  . Misc Natural Products (OSTEO BI-FLEX JOINT SHIELD PO) Take by mouth.    . Multiple Vitamin (MULTIVITAMIN) tablet Take 1 tablet by mouth daily.    . Omega-3 Fatty Acids (FISH OIL PO) Take by mouth daily.    [name] amphetamine-dextroamphetamine (ADDERALL) 10 MG tablet Take 1 tablet (10 mg total) by mouth 2 (two) times daily. 60 tablet 0  . celecoxib (CELEBREX) 100 MG capsule TAKE 1 CAPSULE BY MOUTH TWICE DAILY AS NEEDED 60 capsule 0  . cyclobenzaprine (FLEXERIL) 10 MG tablet TAKE ONE TABLET BY MOUTH AT BEDTIME AS NEEDED FOR  MUSCLE  SPASMS 90 tablet 0   No facility-administered medications prior to visit.    Allergies  Allergen  Reactions  . Penicillins Rash    ROS Review of Systems    Objective:    Physical Exam  Constitutional: He is oriented to person, place, and time. He appears well-developed and well-nourished.  HENT:  Head: Normocephalic and atraumatic.  Eyes: Conjunctivae are normal.  Cardiovascular: Normal rate, regular rhythm and normal heart sounds.  Pulmonary/Chest: Effort normal and breath sounds normal.  Musculoskeletal:     Cervical back: Neck supple.  Neurological: He is alert and oriented to person, place, and time.  Skin: Skin is warm and dry.  Psychiatric: He has a normal mood and affect. His behavior is normal.    BP (!) 157/87   Pulse 77   Ht 5\' 8"  (1.727 m)   Wt 256 lb (116.1 kg)   SpO2 99%   BMI 38.92 kg/m  Wt Readings from Last 3 Encounters:  05/29/19 256 lb (116.1 kg)  12/24/18 250 lb (113.4 kg)  12/12/18 254 lb 3.2 oz (115.3 kg)     There are no preventive care reminders to display for this patient.  There are no preventive care reminders to display for this patient.  Lab Results  Component Value Date   TSH 4.14 08/05/2015   Lab Results  Component Value Date   WBC 5.9 08/05/2015   HGB 15.5 08/05/2015   HCT 45.1 08/05/2015   MCV 95.1 08/05/2015   PLT 155 08/05/2015   Lab Results  Component Value Date   NA 138 11/28/2018   K 4.7 11/28/2018   CO2 22 11/28/2018   GLUCOSE 102 (H) 11/28/2018   BUN 18 11/28/2018   CREATININE 1.06 11/28/2018   BILITOT 0.9 11/28/2018   ALKPHOS 43 08/05/2015   AST 45 (H) 11/28/2018   ALT 73 (H) 11/28/2018   PROT 7.3 11/28/2018   ALBUMIN 4.6 08/05/2015   CALCIUM 9.7 11/28/2018   Lab Results  Component Value Date   CHOL 221 (H) 11/28/2018   Lab Results  Component Value Date   HDL 51 11/28/2018   Lab Results  Component Value Date   LDLCALC 142 (H) 11/28/2018   Lab Results  Component Value Date   TRIG 149 11/28/2018   Lab Results  Component Value Date   CHOLHDL 4.3 11/28/2018   No results found for:  HGBA1C    Assessment & Plan:   Problem List Items Addressed This Visit  Cardiovascular and Mediastinum   Essential hypertension, benign    BP not well controlled. Had salt diet last night.  Commend return for nurse visit in a couple of weeks to recheck we will adjust medication at that time if not well controlled.      Relevant Orders   COMPLETE METABOLIC PANEL WITH GFR   Hemoglobin A1c     Other   Arthralgia   ADD (attention deficit disorder) - Primary    Dong well on current regimen.  Usually splits the tab and takes a half in the morning and half in the afternoon.  He would prefer to keep it that way instead of switching to the 5 mg.  No recent chest pain shortness breath or palpitations on the medication.        Relevant Medications   amphetamine-dextroamphetamine (ADDERALL) 10 MG tablet   amphetamine-dextroamphetamine (ADDERALL) 10 MG tablet (Start on 06/29/2019)    Other Visit Diagnoses    Elevated liver enzymes       Relevant Orders   COMPLETE METABOLIC PANEL WITH GFR   Abnormal glucose       Relevant Orders   Hemoglobin A1c     Abnormal glucose-last labs showed a fasting glucose of 102 so would like to get an A1c today to screen for prediabetes.  Elevated liver enzymes-due to recheck those today as well.  Encouraged him to continue to work on healthy diet and regular exercise to reduce inflammation of the liver.  Meds ordered this encounter  Medications  . amphetamine-dextroamphetamine (ADDERALL) 10 MG tablet    Sig: Take 1 tablet (10 mg total) by mouth 2 (two) times daily.    Dispense:  60 tablet    Refill:  0  . amphetamine-dextroamphetamine (ADDERALL) 10 MG tablet    Sig: Take 1 tablet (10 mg total) by mouth 2 (two) times daily.    Dispense:  60 tablet    Refill:  0  . celecoxib (CELEBREX) 100 MG capsule    Sig: Take 1 capsule (100 mg total) by mouth 2 (two) times daily.    Dispense:  180 capsule    Refill:  1    Follow-up: Return in about 6 months  (around 11/29/2019) for Hypertension and ADD.    Beatrice Lecher, MD

## 2019-05-29 NOTE — Assessment & Plan Note (Signed)
Jesus Kennedy well on current regimen.  Usually splits the tab and takes a half in the morning and half in the afternoon.  He would prefer to keep it that way instead of switching to the 5 mg.  No recent chest pain shortness breath or palpitations on the medication.

## 2019-05-30 ENCOUNTER — Other Ambulatory Visit: Payer: Self-pay | Admitting: Family Medicine

## 2019-05-30 DIAGNOSIS — I10 Essential (primary) hypertension: Secondary | ICD-10-CM

## 2019-06-02 ENCOUNTER — Other Ambulatory Visit: Payer: Self-pay | Admitting: *Deleted

## 2019-06-02 DIAGNOSIS — I10 Essential (primary) hypertension: Secondary | ICD-10-CM

## 2019-06-02 LAB — HEMOGLOBIN A1C
Hgb A1c MFr Bld: 4.8 % of total Hgb (ref <5.7)
Mean Plasma Glucose: 91 (calc)
eAG (mmol/L): 5 (calc)

## 2019-06-02 LAB — COMPLETE METABOLIC PANEL WITH GFR
AG Ratio: 1.6 (calc) (ref 1.0–2.5)
ALT: 74 U/L — ABNORMAL HIGH (ref 9–46)
AST: 42 U/L — ABNORMAL HIGH (ref 10–35)
Albumin: 4.5 g/dL (ref 3.6–5.1)
Alkaline phosphatase (APISO): 48 U/L (ref 35–144)
BUN: 17 mg/dL (ref 7–25)
CO2: 26 mmol/L (ref 20–32)
Calcium: 10.2 mg/dL (ref 8.6–10.3)
Chloride: 104 mmol/L (ref 98–110)
Creat: 1 mg/dL (ref 0.70–1.33)
GFR, Est African American: 101 mL/min/{1.73_m2} (ref >=60)
GFR, Est Non African American: 87 mL/min/{1.73_m2} (ref >=60)
Globulin: 2.9 g/dL (calc) (ref 1.9–3.7)
Glucose, Bld: 92 mg/dL (ref 65–139)
Potassium: 4.5 mmol/L (ref 3.5–5.3)
Sodium: 139 mmol/L (ref 135–146)
Total Bilirubin: 1 mg/dL (ref 0.2–1.2)
Total Protein: 7.4 g/dL (ref 6.1–8.1)

## 2019-06-02 LAB — ACUTE HEP PANEL AND HEP B SURFACE AB
HEPATITIS C ANTIBODY REFILL$(REFL): NONREACTIVE
Hep A IgM: NONREACTIVE
Hep B C IgM: NONREACTIVE
Hepatitis B Surface Ag: NONREACTIVE
SIGNAL TO CUT-OFF: 0.01 (ref <1.00)

## 2019-06-02 LAB — REFLEX TIQ

## 2019-06-02 MED ORDER — LOSARTAN POTASSIUM 100 MG PO TABS: 100.0000 mg | ORAL_TABLET | Freq: Every day | ORAL | 1 refills | Status: DC

## 2019-10-10 ENCOUNTER — Other Ambulatory Visit: Payer: Self-pay

## 2019-10-10 DIAGNOSIS — F988 Other specified behavioral and emotional disorders with onset usually occurring in childhood and adolescence: Secondary | ICD-10-CM

## 2019-10-10 MED ORDER — AMPHETAMINE-DEXTROAMPHETAMINE 10 MG PO TABS: 10.0000 mg | ORAL_TABLET | Freq: Two times a day (BID) | ORAL | 0 refills | Status: DC

## 2020-01-05 ENCOUNTER — Other Ambulatory Visit (HOSPITAL_BASED_OUTPATIENT_CLINIC_OR_DEPARTMENT_OTHER): Payer: Self-pay | Admitting: Internal Medicine

## 2020-01-05 ENCOUNTER — Ambulatory Visit: Payer: Managed Care, Other (non HMO) | Attending: Internal Medicine

## 2020-01-05 DIAGNOSIS — Z23 Encounter for immunization: Secondary | ICD-10-CM

## 2020-01-05 NOTE — Progress Notes (Signed)
° °  Covid-19 Vaccination Clinic  Name:  MAXIMUM REILAND    MRN: [id number] DOB: 20-Sep-1965  01/05/2020  Mr. Mcelvain was observed post Covid-19 immunization for 15 minutes without incident. He was provided with Vaccine Information Sheet and instruction to access the V-Safe system.   Mr. Morrish was instructed to call 911 with any severe reactions post vaccine:  Difficulty breathing   Swelling of face and throat   A fast heartbeat   A bad rash all over body   Dizziness and weakness   Immunizations Administered    Name Date Dose VIS Date Route   Moderna Covid-19 Booster Vaccine 01/05/2020 11:48 AM 0.25 mL 11/08/2019 Intramuscular   Manufacturer: Moderna   Lot: [id number]   [place]: [id number]

## 2020-01-08 MED FILL — MODERNA COVID-19 VACCINE 10: 100 | 1 days supply | Qty: 0 | Fill #0

## 2020-01-18 ENCOUNTER — Other Ambulatory Visit: Payer: Self-pay | Admitting: Family Medicine

## 2020-01-18 DIAGNOSIS — I10 Essential (primary) hypertension: Secondary | ICD-10-CM

## 2020-02-09 ENCOUNTER — Telehealth: Payer: Self-pay | Admitting: Family Medicine

## 2020-02-09 DIAGNOSIS — I10 Essential (primary) hypertension: Secondary | ICD-10-CM

## 2020-02-09 MED ORDER — LOSARTAN POTASSIUM 100 MG PO TABS: 100.0000 mg | ORAL_TABLET | Freq: Every day | ORAL | 0 refills | Status: DC

## 2020-02-09 NOTE — Telephone Encounter (Signed)
Losartan sent. Not sure if needs anything else.

## 2020-02-09 NOTE — Telephone Encounter (Signed)
I have patient scheduled for f/u appt for HTN on 02/23/20, patient didn't know if he could have a refill on meds up until his appt date. AM

## 2020-02-09 NOTE — Telephone Encounter (Signed)
Left message advising of the refill.

## 2020-02-13 ENCOUNTER — Other Ambulatory Visit: Payer: Self-pay | Admitting: Family Medicine

## 2020-02-13 DIAGNOSIS — F988 Other specified behavioral and emotional disorders with onset usually occurring in childhood and adolescence: Secondary | ICD-10-CM

## 2020-02-15 MED ORDER — AMPHETAMINE-DEXTROAMPHETAMINE 10 MG PO TABS: 10.0000 mg | ORAL_TABLET | Freq: Two times a day (BID) | ORAL | 0 refills | Status: DC

## 2020-02-15 NOTE — Telephone Encounter (Signed)
Last written 10/10/2019 #60 no refills Last appt 05/29/2019

## 2020-02-15 NOTE — Telephone Encounter (Signed)
Med sent. Keep appt in [place]

## 2020-02-21 NOTE — Progress Notes (Signed)
Established Patient Office Visit  Subjective:  Patient ID: Jesus Kennedy, male    DOB: Dec 18, 1965  Age: 55 y.o. MRN: [id number]  CC:  Chief Complaint  Patient presents with   Hypertension    HPI Jesus Kennedy presents for   Hypertension- Pt denies chest pain, SOB, dizziness, or heart palpitations.  Taking meds as directed w/o problems.  Denies medication side effects.    ADD - Reports symptoms are well controlled on current regime. Denies any problems with insomnia, chest pain, palpitations, or SOB.     Past Medical History:  Diagnosis Date   Arthritis    LEFT KNEE,R SHOULDER   Hypertension     Past Surgical History:  Procedure Laterality Date   APPENDECTOMY  2011   COLONOSCOPY     KNEE SURGERY     LEFT KNEE April 27 2017    Family History  Problem Relation Age of Onset   Depression Mother    Hyperlipidemia Mother    Hypertension Mother        father   COPD Mother        smoker   Hypothyroidism Father    Hypothyroidism Brother    Heart defect Daughter        Tetralogy of Fallot    Colon cancer Neg Hx    Colon polyps Neg Hx    Esophageal cancer Neg Hx    Prostate cancer Neg Hx    Rectal cancer Neg Hx    Stomach cancer Neg Hx     Social History   Socioeconomic History   Marital status: Married    Spouse name: Diane   Number of children: 1   Years of education: Not on file   Highest education level: Not on file  Occupational History   Occupation: Firefighter    Comment: [employer]  Tobacco Use   Smoking status: Never Smoker   Smokeless tobacco: Never Used  Building services engineer Use: Never used  Substance and Sexual Activity   Alcohol use: Yes    Comment: 3-4 X A WEEK   Drug use: No   Sexual activity: Yes    Partners: Female  Other Topics Concern   Not on file  Social History Narrative   No regular exercise but he does have a very physically active job.   Social Determinants of Health    Financial Resource Strain: Not on file  Food Insecurity: Not on file  Transportation Needs: Not on file  Physical Activity: Not on file  Stress: Not on file  Social Connections: Not on file  Intimate Partner Violence: Not on file    Outpatient Medications Prior to Visit  Medication Sig Dispense Refill   AMBULATORY NON FORMULARY MEDICATION Take 2 tablets by mouth daily. Medication Name: focus factor     B Complex-Biotin-FA (SUPER B-COMPLEX PO) Take by mouth.     celecoxib (CELEBREX) 100 MG capsule Take 1 capsule (100 mg total) by mouth 2 (two) times daily. 180 capsule 1   Coenzyme Q10 (CO Q 10 PO) Take by mouth.     Misc Natural Products (OSTEO BI-FLEX JOINT SHIELD PO) Take by mouth.     Multiple Vitamin (MULTIVITAMIN) tablet Take 1 tablet by mouth daily.     Omega-3 Fatty Acids (FISH OIL PO) Take by mouth daily.     amphetamine-dextroamphetamine (ADDERALL) 10 MG tablet Take 1 tablet (10 mg total) by mouth 2 (two) times daily. 60 tablet 0   amphetamine-dextroamphetamine (ADDERALL)  10 MG tablet Take 1 tablet (10 mg total) by mouth 2 (two) times daily. 60 tablet 0   losartan (COZAAR) 100 MG tablet Take 1 tablet (100 mg total) by mouth daily. PATIENT DUE FOR LABS AND APPT 30 tablet 0   No facility-administered medications prior to visit.    Allergies  Allergen Reactions   Penicillins Rash    ROS Review of Systems    Objective:    Physical Exam Constitutional:      Appearance: He is well-developed and well-nourished.  HENT:     Head: Normocephalic and atraumatic.  Cardiovascular:     Rate and Rhythm: Normal rate and regular rhythm.     Heart sounds: Normal heart sounds.  Pulmonary:     Effort: Pulmonary effort is normal.     Breath sounds: Normal breath sounds.  Skin:    General: Skin is warm and dry.  Neurological:     Mental Status: He is alert and oriented to person, place, and time.  Psychiatric:        Mood and Affect: Mood and affect normal.         Behavior: Behavior normal.     BP (!) 145/95    Pulse 66    Temp 98 F (36.7 C)    Wt 248 lb (112.5 kg)    SpO2 98%    BMI 37.71 kg/m  Wt Readings from Last 3 Encounters:  02/23/20 248 lb (112.5 kg)  05/29/19 256 lb (116.1 kg)  12/24/18 250 lb (113.4 kg)     Health Maintenance Due  Topic Date Due   Hepatitis C Screening  Never done    There are no preventive care reminders to display for this patient.  Lab Results  Component Value Date   TSH 4.14 08/05/2015   Lab Results  Component Value Date   WBC 5.9 08/05/2015   HGB 15.5 08/05/2015   HCT 45.1 08/05/2015   MCV 95.1 08/05/2015   PLT 155 08/05/2015   Lab Results  Component Value Date   NA 139 05/29/2019   K 4.5 05/29/2019   CO2 26 05/29/2019   GLUCOSE 92 05/29/2019   BUN 17 05/29/2019   CREATININE 1.00 05/29/2019   BILITOT 1.0 05/29/2019   ALKPHOS 43 08/05/2015   AST 42 (H) 05/29/2019   ALT 74 (H) 05/29/2019   PROT 7.4 05/29/2019   ALBUMIN 4.6 08/05/2015   CALCIUM 10.2 05/29/2019   Lab Results  Component Value Date   CHOL 221 (H) 11/28/2018   Lab Results  Component Value Date   HDL 51 11/28/2018   Lab Results  Component Value Date   LDLCALC 142 (H) 11/28/2018   Lab Results  Component Value Date   TRIG 149 11/28/2018   Lab Results  Component Value Date   CHOLHDL 4.3 11/28/2018   Lab Results  Component Value Date   HGBA1C 4.8 05/29/2019      Assessment & Plan:   Problem List Items Addressed This Visit      Cardiovascular and Mediastinum   Essential hypertension, benign - Primary    Blood pressure still elevated today and it sounds like he still getting some blood pressures in the 140s at home.  We will add hydrochlorothiazide to medication regimen.  Continue to monitor blood pressure at home.  Recommend he go for his labs in 1 to 2 weeks after starting the new medication so we can check his potassium and renal function otherwise I will see him back in 6  months if he is doing well.   Normally I would have him come in in about 2 weeks for nurse visit but he is extremely busy with work      Relevant Medications   losartan-hydrochlorothiazide (HYZAAR) 100-25 MG tablet   Other Relevant Orders   COMPLETE METABOLIC PANEL WITH GFR   Lipid panel   CBC     Other   Work stress    Work is been really busy and therefore stressful but he actually feels like he is doing okay overall.      BMI 37.0-37.9, adult    He has been purposely trying to lose weight and has actually been doing well with that he really cut out a lot of carbs, fast food, prepackaged and processed foods and has already lost a fair amount of weight.  His goal is to get down to about 220 pounds.  He really is doing a great job encouraged him to just continue to work out.      ADD (attention deficit disorder)    Doing well on current regimen.  Refill sent for the next 90 days.  Follow-up in 6 months.      Relevant Medications   amphetamine-dextroamphetamine (ADDERALL) 10 MG tablet (Start on 04/20/2020)   amphetamine-dextroamphetamine (ADDERALL) 10 MG tablet (Start on 03/21/2020)   amphetamine-dextroamphetamine (ADDERALL) 10 MG tablet      Meds ordered this encounter  Medications   amphetamine-dextroamphetamine (ADDERALL) 10 MG tablet    Sig: Take 1 tablet (10 mg total) by mouth 2 (two) times daily.    Dispense:  60 tablet    Refill:  0   amphetamine-dextroamphetamine (ADDERALL) 10 MG tablet    Sig: Take 1 tablet (10 mg total) by mouth 2 (two) times daily.    Dispense:  60 tablet    Refill:  0   amphetamine-dextroamphetamine (ADDERALL) 10 MG tablet    Sig: Take 1 tablet (10 mg total) by mouth 2 (two) times daily.    Dispense:  60 tablet    Refill:  0   losartan-hydrochlorothiazide (HYZAAR) 100-25 MG tablet    Sig: Take 1 tablet by mouth daily.    Dispense:  90 tablet    Refill:  1    Follow-up: Return in about 6 months (around 08/22/2020) for Hypertension.    Beatrice Lecher, MD

## 2020-02-23 ENCOUNTER — Other Ambulatory Visit: Payer: Self-pay

## 2020-02-23 ENCOUNTER — Encounter: Payer: Self-pay | Admitting: Family Medicine

## 2020-02-23 ENCOUNTER — Ambulatory Visit (INDEPENDENT_AMBULATORY_CARE_PROVIDER_SITE_OTHER): Payer: Managed Care, Other (non HMO) | Admitting: Family Medicine

## 2020-02-23 VITALS — BP 145/95 | HR 66 | Temp 98.0°F | Wt 248.0 lb

## 2020-02-23 DIAGNOSIS — I10 Essential (primary) hypertension: Secondary | ICD-10-CM | POA: Diagnosis not present

## 2020-02-23 DIAGNOSIS — Z6837 Body mass index (BMI) 37.0-37.9, adult: Secondary | ICD-10-CM

## 2020-02-23 DIAGNOSIS — Z566 Other physical and mental strain related to work: Secondary | ICD-10-CM

## 2020-02-23 DIAGNOSIS — F988 Other specified behavioral and emotional disorders with onset usually occurring in childhood and adolescence: Secondary | ICD-10-CM

## 2020-02-23 MED ORDER — AMPHETAMINE-DEXTROAMPHETAMINE 10 MG PO TABS: 10.0000 mg | ORAL_TABLET | Freq: Two times a day (BID) | ORAL | 0 refills | Status: DC

## 2020-02-23 MED ORDER — LOSARTAN POTASSIUM-HCTZ 100-25 MG PO TABS: 1.0000 | ORAL_TABLET | Freq: Every day | ORAL | 1 refills | Status: DC

## 2020-02-23 NOTE — Assessment & Plan Note (Signed)
He has been purposely trying to lose weight and has actually been doing well with that he really cut out a lot of carbs, fast food, prepackaged and processed foods and has already lost a fair amount of weight.  His goal is to get down to about 220 pounds.  He really is doing a great job encouraged him to just continue to work out.

## 2020-02-23 NOTE — Assessment & Plan Note (Signed)
Doing well on current regimen.  Refill sent for the next 90 days.  Follow-up in 6 months.

## 2020-02-23 NOTE — Assessment & Plan Note (Signed)
Blood pressure still elevated today and it sounds like he still getting some blood pressures in the 140s at home.  We will add hydrochlorothiazide to medication regimen.  Continue to monitor blood pressure at home.  Recommend he go for his labs in 1 to 2 weeks after starting the new medication so we can check his potassium and renal function otherwise I will see him back in 6 months if he is doing well.  Normally I would have him come in in about 2 weeks for nurse visit but he is extremely busy with work

## 2020-02-23 NOTE — Assessment & Plan Note (Signed)
Work is been really busy and therefore stressful but he actually feels like he is doing okay overall.

## 2020-03-28 ENCOUNTER — Other Ambulatory Visit: Payer: Self-pay | Admitting: Family Medicine

## 2020-03-28 DIAGNOSIS — I10 Essential (primary) hypertension: Secondary | ICD-10-CM

## 2020-06-23 ENCOUNTER — Other Ambulatory Visit: Payer: Self-pay | Admitting: Family Medicine

## 2020-06-23 DIAGNOSIS — F988 Other specified behavioral and emotional disorders with onset usually occurring in childhood and adolescence: Secondary | ICD-10-CM

## 2020-06-25 ENCOUNTER — Other Ambulatory Visit: Payer: Self-pay | Admitting: *Deleted

## 2020-06-25 DIAGNOSIS — I10 Essential (primary) hypertension: Secondary | ICD-10-CM

## 2020-06-25 MED ORDER — AMPHETAMINE-DEXTROAMPHETAMINE 10 MG PO TABS: 10.0000 mg | ORAL_TABLET | Freq: Two times a day (BID) | ORAL | 0 refills | Status: DC

## 2020-06-25 MED ORDER — LOSARTAN POTASSIUM 100 MG PO TABS: 100.0000 mg | ORAL_TABLET | Freq: Every day | ORAL | 0 refills | Status: DC

## 2020-06-25 MED ORDER — CELECOXIB 100 MG PO CAPS: 100.0000 mg | ORAL_CAPSULE | Freq: Two times a day (BID) | ORAL | 1 refills | Status: DC

## 2020-08-22 ENCOUNTER — Encounter: Payer: Self-pay | Admitting: Family Medicine

## 2020-08-22 ENCOUNTER — Ambulatory Visit (INDEPENDENT_AMBULATORY_CARE_PROVIDER_SITE_OTHER): Payer: Managed Care, Other (non HMO) | Admitting: Family Medicine

## 2020-08-22 VITALS — BP 142/81 | HR 78 | Temp 98.3°F | Wt 252.0 lb

## 2020-08-22 DIAGNOSIS — Z23 Encounter for immunization: Secondary | ICD-10-CM

## 2020-08-22 DIAGNOSIS — M2550 Pain in unspecified joint: Secondary | ICD-10-CM

## 2020-08-22 DIAGNOSIS — I10 Essential (primary) hypertension: Secondary | ICD-10-CM

## 2020-08-22 DIAGNOSIS — F988 Other specified behavioral and emotional disorders with onset usually occurring in childhood and adolescence: Secondary | ICD-10-CM

## 2020-08-22 MED ORDER — AMPHETAMINE-DEXTROAMPHETAMINE 10 MG PO TABS: 10.0000 mg | ORAL_TABLET | Freq: Two times a day (BID) | ORAL | 0 refills | Status: DC

## 2020-08-22 MED ORDER — AMLODIPINE BESYLATE 2.5 MG PO TABS: 2.5000 mg | ORAL_TABLET | Freq: Every day | ORAL | 1 refills | Status: DC

## 2020-08-22 MED ORDER — CELECOXIB 100 MG PO CAPS: 100.0000 mg | ORAL_CAPSULE | Freq: Two times a day (BID) | ORAL | 1 refills | Status: DC

## 2020-08-22 NOTE — Assessment & Plan Note (Addendum)
Uncontrolled.  Will try amlodipine in addition to the losartan.  F/U in 6 mo

## 2020-08-22 NOTE — Assessment & Plan Note (Signed)
Well controlled. Continue current regimen. Follow up in  6 mo  

## 2020-08-22 NOTE — Assessment & Plan Note (Addendum)
Requested refill on his Celebrex today.  New prescription sent to pharmacy.  We will need to keep an eye on renal function with chronic NSAID use.

## 2020-08-22 NOTE — Progress Notes (Signed)
Established Patient Office Visit  Subjective:  Patient ID: Jesus Kennedy, male    DOB: 08-16-1965  Age: 55 y.o. MRN: [id number]  CC:  Chief Complaint  Patient presents with   Hypertension    HPI [name] presents for   Hypertension- Pt denies chest pain, SOB, dizziness, or heart palpitations.  Taking meds as directed w/o problems.  Denies medication side effects.  He was sexual side effects on the HCTZ.  So went back to the losartan '200mg'$ .  [name]    ADD - Reports symptoms are well controlled on current regime. Denies any problems with insomnia, chest pain, palpitations, or SOB.     He also reports that he has not been sleeping well.  Its been more of a chronic issue and does not feel like it is related to the Adderall.  He says it just tends to run in his family.   Past Medical History:  Diagnosis Date   Arthritis    LEFT KNEE,R SHOULDER   Hypertension     Past Surgical History:  Procedure Laterality Date   APPENDECTOMY  2011   COLONOSCOPY     KNEE SURGERY     LEFT KNEE April 27 2017    Family History  Problem Relation Age of Onset   Depression Mother    Hyperlipidemia Mother    Hypertension Mother        father   COPD Mother        smoker   Hypothyroidism Father    Hypothyroidism Brother    Heart defect Daughter        Tetralogy of Fallot    Colon cancer Neg Hx    Colon polyps Neg Hx    Esophageal cancer Neg Hx    Prostate cancer Neg Hx    Rectal cancer Neg Hx    Stomach cancer Neg Hx     Social History   Socioeconomic History   Marital status: Married    Spouse name: Diane   Number of children: 1   Years of education: Not on file   Highest education level: Not on file  Occupational History   Occupation: Careers information officer    Comment: [employer]  Tobacco Use   Smoking status: Never   Smokeless tobacco: Never  Vaping Use   Vaping Use: Never used  Substance and Sexual Activity   Alcohol use: Yes    Comment: 3-4 X A WEEK    Drug use: No   Sexual activity: Yes    Partners: Female  Other Topics Concern   Not on file  Social History Narrative   No regular exercise but he does have a very physically active job.   Social Determinants of Health   Financial Resource Strain: Not on file  Food Insecurity: Not on file  Transportation Needs: Not on file  Physical Activity: Not on file  Stress: Not on file  Social Connections: Not on file  Intimate Partner Violence: Not on file    Outpatient Medications Prior to Visit  Medication Sig Dispense Refill   AMBULATORY NON FORMULARY MEDICATION Take 2 tablets by mouth daily. Medication Name: focus factor     [START ON 08/23/2020] amphetamine-dextroamphetamine (ADDERALL) 10 MG tablet Take 1 tablet (10 mg total) by mouth 2 (two) times daily. 60 tablet 0   B Complex-Biotin-FA (SUPER B-COMPLEX PO) Take by mouth.     Coenzyme Q10 (CO Q 10 PO) Take by mouth.     COVID-19 mRNA vaccine, Moderna, 100  MCG/0.5ML injection INJECT AS DIRECTED .25 mL 0   losartan (COZAAR) 100 MG tablet Take 1 tablet (100 mg total) by mouth daily. 90 tablet 0   Misc Natural Products (OSTEO BI-FLEX JOINT SHIELD PO) Take by mouth.     Multiple Vitamin (MULTIVITAMIN) tablet Take 1 tablet by mouth daily.     Omega-3 Fatty Acids (FISH OIL PO) Take by mouth daily.     amphetamine-dextroamphetamine (ADDERALL) 10 MG tablet Take 1 tablet (10 mg total) by mouth 2 (two) times daily. 60 tablet 0   amphetamine-dextroamphetamine (ADDERALL) 10 MG tablet Take 1 tablet (10 mg total) by mouth 2 (two) times daily. 60 tablet 0   celecoxib (CELEBREX) 100 MG capsule Take 1 capsule (100 mg total) by mouth 2 (two) times daily. 180 capsule 1   No facility-administered medications prior to visit.    Allergies  Allergen Reactions   Penicillins Rash   Hydrochlorothiazide Other (See Comments)    ROS Review of Systems    Objective:    Physical Exam Constitutional:      Appearance: Normal appearance. He is  well-developed.  HENT:     Head: Normocephalic and atraumatic.  Cardiovascular:     Rate and Rhythm: Normal rate and regular rhythm.     Heart sounds: Normal heart sounds.  Pulmonary:     Effort: Pulmonary effort is normal.     Breath sounds: Normal breath sounds.  Skin:    General: Skin is warm and dry.  Neurological:     Mental Status: He is alert and oriented to person, place, and time. Mental status is at baseline.  Psychiatric:        Behavior: Behavior normal.   BP (!) 142/81   Pulse 78   Temp 98.3 F (36.8 C) (Oral)   Wt 252 lb (114.3 kg)   SpO2 98% Comment: on RA  BMI 38.32 kg/m  Wt Readings from Last 3 Encounters:  08/22/20 252 lb (114.3 kg)  02/23/20 248 lb (112.5 kg)  05/29/19 256 lb (116.1 kg)     Health Maintenance Due  Topic Date Due   Hepatitis C Screening  Never done   COVID-19 Vaccine (3 - Booster for Janssen series) 05/05/2020   INFLUENZA VACCINE  08/19/2020    There are no preventive care reminders to display for this patient.  Lab Results  Component Value Date   TSH 4.14 08/05/2015   Lab Results  Component Value Date   WBC 5.9 08/05/2015   HGB 15.5 08/05/2015   HCT 45.1 08/05/2015   MCV 95.1 08/05/2015   PLT 155 08/05/2015   Lab Results  Component Value Date   NA 139 05/29/2019   K 4.5 05/29/2019   CO2 26 05/29/2019   GLUCOSE 92 05/29/2019   BUN 17 05/29/2019   CREATININE 1.00 05/29/2019   BILITOT 1.0 05/29/2019   ALKPHOS 43 08/05/2015   AST 42 (H) 05/29/2019   ALT 74 (H) 05/29/2019   PROT 7.4 05/29/2019   ALBUMIN 4.6 08/05/2015   CALCIUM 10.2 05/29/2019   Lab Results  Component Value Date   CHOL 221 (H) 11/28/2018   Lab Results  Component Value Date   HDL 51 11/28/2018   Lab Results  Component Value Date   LDLCALC 142 (H) 11/28/2018   Lab Results  Component Value Date   TRIG 149 11/28/2018   Lab Results  Component Value Date   CHOLHDL 4.3 11/28/2018   Lab Results  Component Value Date   HGBA1C 4.8  05/29/2019  Assessment & Plan:   Problem List Items Addressed This Visit       Cardiovascular and Mediastinum   Essential hypertension, benign - Primary    Uncontrolled.  Will try amlodipine in addition to the losartan.  F/U in 6 mo        Relevant Medications   amLODipine (NORVASC) 2.5 MG tablet     Other   Arthralgia    Requested refill on his Celebrex today.  New prescription sent to pharmacy.  We will need to keep an eye on renal function with chronic NSAID use.       ADD (attention deficit disorder)    Well controlled. Continue current regimen. Follow up in  69mo       Relevant Medications   amphetamine-dextroamphetamine (ADDERALL) 10 MG tablet (Start on 09/22/2020)   amphetamine-dextroamphetamine (ADDERALL) 10 MG tablet (Start on 10/21/2020)   Other Visit Diagnoses     Need for shingles vaccine       Relevant Orders   Varicella-zoster vaccine IM (Shingrix) (Completed)       Meds ordered this encounter  Medications   celecoxib (CELEBREX) 100 MG capsule    Sig: Take 1 capsule (100 mg total) by mouth 2 (two) times daily.    Dispense:  180 capsule    Refill:  1   amphetamine-dextroamphetamine (ADDERALL) 10 MG tablet    Sig: Take 1 tablet (10 mg total) by mouth 2 (two) times daily.    Dispense:  60 tablet    Refill:  0   amLODipine (NORVASC) 2.5 MG tablet    Sig: Take 1 tablet (2.5 mg total) by mouth daily.    Dispense:  90 tablet    Refill:  1   amphetamine-dextroamphetamine (ADDERALL) 10 MG tablet    Sig: Take 1 tablet (10 mg total) by mouth 2 (two) times daily.    Dispense:  60 tablet    Refill:  0    Follow-up: Return in about 6 months (around 02/22/2021) for ADHD.    CBeatrice Lecher MD

## 2020-08-24 ENCOUNTER — Telehealth (INDEPENDENT_AMBULATORY_CARE_PROVIDER_SITE_OTHER): Payer: Managed Care, Other (non HMO) | Admitting: Medical-Surgical

## 2020-08-24 ENCOUNTER — Encounter: Payer: Self-pay | Admitting: Medical-Surgical

## 2020-08-24 DIAGNOSIS — U071 COVID-19: Secondary | ICD-10-CM

## 2020-08-24 MED ORDER — NIRMATRELVIR/RITONAVIR (PAXLOVID)TABLET: 3.0000 | ORAL_TABLET | Freq: Two times a day (BID) | ORAL | 0 refills | Status: AC

## 2020-08-24 NOTE — Progress Notes (Signed)
Virtual Visit via Telephone   I connected with  Jesus Kennedy  on 08/24/20 by telephone/telehealth and verified that I am speaking with the correct person using two identifiers.   I discussed the limitations, risks, security and privacy concerns of performing an evaluation and management service by telephone, including the higher likelihood of inaccurate diagnosis and treatment, and the availability of in person appointments.  We also discussed the likely need of an additional face to face encounter for complete and high quality delivery of care.  I also discussed with the patient that there may be a patient responsible charge related to this service. The patient expressed understanding and wishes to proceed.  Provider location is in medical facility. Patient location is at their home, different from provider location. People involved in care of the patient during this telehealth encounter were myself, my nurse/medical assistant, and my front office/scheduling team member.  CC: COVID positive  HPI: Pleasant 55- year-old male presenting via telephone after calling the on call service with reports of testing positive for COVID this morning.  Reports he was exposed to COVID-19 last Friday and has been testing every day.  He did develop symptoms last night in the early hours of the morning and received his positive test this morning.  Endorses sinus congestion, chest heaviness, productive cough, and malaise.  Denies fever, chills, chest pain, shortness of breath, and GI symptoms.  He is requesting Paxlovid.  Review of Systems: See HPI for pertinent positives and negatives.   Objective Findings:    General: Speaking full sentences, no audible heavy breathing.  Sounds alert and appropriately interactive.    Impression and Recommendations:    1. COVID-19 virus infection Sending in Paxlovid for 5-day therapy.  Discussed over-the-counter options for treatment of symptoms in the setting of  hypertension.  Recommend Mucinex to help loosen secretions.  Increase p.o. fluids and rest.  Reviewed CDC quarantine guidelines.  I discussed the above assessment and treatment plan with the patient. The patient was provided an opportunity to ask questions and all were answered. The patient agreed with the plan and demonstrated an understanding of the instructions.   The patient was advised to call back or seek an in-person evaluation if the symptoms worsen or if the condition fails to improve as anticipated.  15 minutes of non-face-to-face time was provided during this encounter.  Return if symptoms worsen or fail to improve. ___________________________________________ Samuel Bouche, DNP, APRN, FNP-BC [facility] [place]

## 2020-10-02 ENCOUNTER — Other Ambulatory Visit: Payer: Self-pay | Admitting: Family Medicine

## 2020-10-02 DIAGNOSIS — I10 Essential (primary) hypertension: Secondary | ICD-10-CM

## 2020-12-09 ENCOUNTER — Other Ambulatory Visit: Payer: Self-pay | Admitting: *Deleted

## 2020-12-09 DIAGNOSIS — F988 Other specified behavioral and emotional disorders with onset usually occurring in childhood and adolescence: Secondary | ICD-10-CM

## 2020-12-09 MED ORDER — AMPHETAMINE-DEXTROAMPHETAMINE 10 MG PO TABS: 10.0000 mg | ORAL_TABLET | Freq: Two times a day (BID) | ORAL | 0 refills | Status: DC

## 2021-01-17 ENCOUNTER — Other Ambulatory Visit: Payer: Self-pay | Admitting: Family Medicine

## 2021-01-17 DIAGNOSIS — I10 Essential (primary) hypertension: Secondary | ICD-10-CM

## 2021-02-24 ENCOUNTER — Ambulatory Visit: Payer: Managed Care, Other (non HMO) | Admitting: Family Medicine

## 2021-02-24 ENCOUNTER — Other Ambulatory Visit: Payer: Self-pay | Admitting: Family Medicine

## 2021-02-24 ENCOUNTER — Encounter: Payer: Self-pay | Admitting: Family Medicine

## 2021-02-24 ENCOUNTER — Other Ambulatory Visit: Payer: Self-pay

## 2021-02-24 VITALS — BP 145/83 | HR 79 | Resp 18 | Ht 68.0 in | Wt 254.0 lb

## 2021-02-24 DIAGNOSIS — F5102 Adjustment insomnia: Secondary | ICD-10-CM | POA: Diagnosis not present

## 2021-02-24 DIAGNOSIS — I10 Essential (primary) hypertension: Secondary | ICD-10-CM

## 2021-02-24 DIAGNOSIS — F988 Other specified behavioral and emotional disorders with onset usually occurring in childhood and adolescence: Secondary | ICD-10-CM

## 2021-02-24 DIAGNOSIS — E785 Hyperlipidemia, unspecified: Secondary | ICD-10-CM | POA: Diagnosis not present

## 2021-02-24 MED ORDER — AMPHETAMINE-DEXTROAMPHETAMINE 10 MG PO TABS: 10.0000 mg | ORAL_TABLET | Freq: Two times a day (BID) | ORAL | 0 refills | Status: DC

## 2021-02-24 MED ORDER — AMLODIPINE BESYLATE 5 MG PO TABS: 5.0000 mg | ORAL_TABLET | Freq: Every day | ORAL | 1 refills | Status: DC

## 2021-02-24 MED ORDER — TRAZODONE HCL 50 MG PO TABS: 25.0000 mg | ORAL_TABLET | Freq: Every evening | ORAL | 2 refills | Status: DC | PRN

## 2021-02-24 NOTE — Assessment & Plan Note (Addendum)
Blood pressure uncontrolled and has been for the last couple of visits here.  We will increase amlodipine to 5 mg.  He is currently on plain losartan.  He says he was intolerant to hydrochlorothiazide.  If blood pressure still not at goal then we could consider switching losartan to valsartan for just a little bit more power for Edarbi.  Is checking blood pressure at home on the weekends if he decides to skip his Adderall just to see if his pressures lower and to see if it could actually be contributing to his elevated blood pressures.

## 2021-02-24 NOTE — Assessment & Plan Note (Signed)
Refilled Adderall today.  Though we did discuss checking his blood pressure on the weekends if he does not take the medication just to see if it might be contributing to his recent elevation in blood pressures.  We can always make changes if needed if it really is impacting his blood pressure.

## 2021-02-24 NOTE — Assessment & Plan Note (Signed)
Due to recheck lipids. 

## 2021-02-24 NOTE — Assessment & Plan Note (Signed)
G[facility] options has tried multiple medications over-the-counter.  Recommended trial of trazodone which could help with some of the mood and anxiety related symptoms that he is experiencing in addition to helping with sleep.  Start with half of a tab and then okay to increase to 1 tab or even 2 tabs if needed.  Follow-up in a couple of months and we can make an adjustment then if needed.

## 2021-02-24 NOTE — Progress Notes (Signed)
Established Patient Office Visit  Subjective:  Patient ID: Jesus Kennedy, male    DOB: Apr 05, 1965  Age: 56 y.o. MRN: [id number]  CC:  Chief Complaint  Patient presents with   ADHD    Follow up    Hypertension    Follow up    Insomnia    Difficulty staying asleep     HPI Jesus Kennedy presents for   ADD - Reports symptoms are well controlled on current regime. Denies any problems with insomnia, chest pain, palpitations, or SOB.    Hypertension- Pt denies chest pain, SOB, dizziness, or heart palpitations.  Taking meds as directed w/o problems.  Denies medication side effects.    Wants to discuss his sleep.  He says starting in January he has been really wanting to cut out alcohol completely.  He often has a couple drinks in the evening to mostly help him sleep and wind down and calm his mind down.  He feels like his ADD he just really drives at night and it just keeps his mind clicking and spending he says that by about a third night of avoiding alcohol the insomnia gets to him to the point that he will end up going ahead and having a few drinks to so that he can get some sleep.  He states he is tried everything over-the-counter including melatonin, ZzzQuil etc.  He also has a lot more aches and pains  and so sometimes that keeps him tossing and turning at night.  He says is not unusual for him to wake up about 8 times a night.  He is only getting about 4 hours of sleep currently.    Past Medical History:  Diagnosis Date   Arthritis    LEFT KNEE,R SHOULDER   Hypertension     Past Surgical History:  Procedure Laterality Date   APPENDECTOMY  2011   COLONOSCOPY     KNEE SURGERY     LEFT KNEE April 27 2017    Family History  Problem Relation Age of Onset   Depression Mother    Hyperlipidemia Mother    Hypertension Mother        father   COPD Mother        smoker   Hypothyroidism Father    Hypothyroidism Brother    Heart defect Daughter        Tetralogy of  Fallot    Colon cancer Neg Hx    Colon polyps Neg Hx    Esophageal cancer Neg Hx    Prostate cancer Neg Hx    Rectal cancer Neg Hx    Stomach cancer Neg Hx     Social History   Socioeconomic History   Marital status: Married    Spouse name: Diane   Number of children: 1   Years of education: Not on file   Highest education level: Not on file  Occupational History   Occupation: Careers information officer    Comment: [employer]  Tobacco Use   Smoking status: Never   Smokeless tobacco: Never  Vaping Use   Vaping Use: Never used  Substance and Sexual Activity   Alcohol use: Yes    Comment: 3-4 X A WEEK   Drug use: No   Sexual activity: Yes    Partners: Female  Other Topics Concern   Not on file  Social History Narrative   No regular exercise but he does have a very physically active job.   Social Determinants of Health  Financial Resource Strain: Not on file  Food Insecurity: Not on file  Transportation Needs: Not on file  Physical Activity: Not on file  Stress: Not on file  Social Connections: Not on file  Intimate Partner Violence: Not on file    Outpatient Medications Prior to Visit  Medication Sig Dispense Refill   AMBULATORY NON FORMULARY MEDICATION Take 2 tablets by mouth daily. Medication Name: focus factor     B Complex-Biotin-FA (SUPER B-COMPLEX PO) Take by mouth.     celecoxib (CELEBREX) 100 MG capsule Take 1 capsule (100 mg total) by mouth 2 (two) times daily. 180 capsule 1   Coenzyme Q10 (CO Q 10 PO) Take by mouth.     losartan (COZAAR) 100 MG tablet Take 1 tablet by mouth once daily 90 tablet 0   Misc Natural Products (OSTEO BI-FLEX JOINT SHIELD PO) Take by mouth.     Multiple Vitamin (MULTIVITAMIN) tablet Take 1 tablet by mouth daily.     Omega-3 Fatty Acids (FISH OIL PO) Take by mouth daily.     amLODipine (NORVASC) 2.5 MG tablet Take 1 tablet (2.5 mg total) by mouth daily. 90 tablet 1   amphetamine-dextroamphetamine (ADDERALL) 10 MG tablet Take 1  tablet (10 mg total) by mouth 2 (two) times daily. 60 tablet 0   amphetamine-dextroamphetamine (ADDERALL) 10 MG tablet Take 1 tablet (10 mg total) by mouth 2 (two) times daily. 60 tablet 0   amphetamine-dextroamphetamine (ADDERALL) 10 MG tablet Take 1 tablet (10 mg total) by mouth 2 (two) times daily. 60 tablet 0   losartan-hydrochlorothiazide (HYZAAR) 100-25 MG tablet Take 1 tablet by mouth daily.     No facility-administered medications prior to visit.    Allergies  Allergen Reactions   Penicillins Rash   Hydrochlorothiazide Other (See Comments)    ROS Review of Systems    Objective:    Physical Exam Constitutional:      Appearance: Normal appearance. He is well-developed.  HENT:     Head: Normocephalic and atraumatic.  Cardiovascular:     Rate and Rhythm: Normal rate and regular rhythm.     Heart sounds: Normal heart sounds.  Pulmonary:     Effort: Pulmonary effort is normal.     Breath sounds: Normal breath sounds.  Skin:    General: Skin is warm and dry.  Neurological:     Mental Status: He is alert and oriented to person, place, and time. Mental status is at baseline.  Psychiatric:        Behavior: Behavior normal.    BP (!) 145/83 (BP Location: Right Arm)    Pulse 79    Resp 18    Ht 5\' 8"  (1.727 m)    Wt 254 lb (115.2 kg)    SpO2 99%    BMI 38.62 kg/m  Wt Readings from Last 3 Encounters:  02/24/21 254 lb (115.2 kg)  08/22/20 252 lb (114.3 kg)  02/23/20 248 lb (112.5 kg)     Health Maintenance Due  Topic Date Due   Hepatitis C Screening  Never done   Zoster Vaccines- Shingrix (2 of 2) 10/17/2020    There are no preventive care reminders to display for this patient.  Lab Results  Component Value Date   TSH 4.14 08/05/2015   Lab Results  Component Value Date   WBC 5.9 08/05/2015   HGB 15.5 08/05/2015   HCT 45.1 08/05/2015   MCV 95.1 08/05/2015   PLT 155 08/05/2015   Lab Results  Component Value Date  NA 139 05/29/2019   K 4.5 05/29/2019    CO2 26 05/29/2019   GLUCOSE 92 05/29/2019   BUN 17 05/29/2019   CREATININE 1.00 05/29/2019   BILITOT 1.0 05/29/2019   ALKPHOS 43 08/05/2015   AST 42 (H) 05/29/2019   ALT 74 (H) 05/29/2019   PROT 7.4 05/29/2019   ALBUMIN 4.6 08/05/2015   CALCIUM 10.2 05/29/2019   Lab Results  Component Value Date   CHOL 221 (H) 11/28/2018   Lab Results  Component Value Date   HDL 51 11/28/2018   Lab Results  Component Value Date   LDLCALC 142 (H) 11/28/2018   Lab Results  Component Value Date   TRIG 149 11/28/2018   Lab Results  Component Value Date   CHOLHDL 4.3 11/28/2018   Lab Results  Component Value Date   HGBA1C 4.8 05/29/2019      Assessment & Plan:   Problem List Items Addressed This Visit       Cardiovascular and Mediastinum   Essential hypertension, benign - Primary    Blood pressure uncontrolled and has been for the last couple of visits here.  We will increase amlodipine to 5 mg.  He is currently on plain losartan.  He says he was intolerant to hydrochlorothiazide.  If blood pressure still not at goal then we could consider switching losartan to valsartan for just a little bit more power for Edarbi.  Is checking blood pressure at home on the weekends if he decides to skip his Adderall just to see if his pressures lower and to see if it could actually be contributing to his elevated blood pressures.      Relevant Medications   losartan-hydrochlorothiazide (HYZAAR) 100-25 MG tablet   amLODipine (NORVASC) 5 MG tablet   Other Relevant Orders   Lipid Panel w/reflex Direct LDL   COMPLETE METABOLIC PANEL WITH GFR   CBC   Hemoglobin A1c     Other   Hyperlipidemia    Due to recheck lipids.      Relevant Medications   losartan-hydrochlorothiazide (HYZAAR) 100-25 MG tablet   amLODipine (NORVASC) 5 MG tablet   Other Relevant Orders   Lipid Panel w/reflex Direct LDL   COMPLETE METABOLIC PANEL WITH GFR   CBC   Hemoglobin A1c   Adjustment insomnia    Gust options  has tried multiple medications over-the-counter.  Recommended trial of trazodone which could help with some of the mood and anxiety related symptoms that he is experiencing in addition to helping with sleep.  Start with half of a tab and then okay to increase to 1 tab or even 2 tabs if needed.  Follow-up in a couple of months and we can make an adjustment then if needed.      Relevant Medications   traZODone (DESYREL) 50 MG tablet   ADD (attention deficit disorder)    Refilled Adderall today.  Though we did discuss checking his blood pressure on the weekends if he does not take the medication just to see if it might be contributing to his recent elevation in blood pressures.  We can always make changes if needed if it really is impacting his blood pressure.      Relevant Medications   amphetamine-dextroamphetamine (ADDERALL) 10 MG tablet (Start on 04/22/2021)   amphetamine-dextroamphetamine (ADDERALL) 10 MG tablet (Start on 03/23/2021)   amphetamine-dextroamphetamine (ADDERALL) 10 MG tablet    Meds ordered this encounter  Medications   amphetamine-dextroamphetamine (ADDERALL) 10 MG tablet    Sig: Take 1  tablet (10 mg total) by mouth 2 (two) times daily.    Dispense:  60 tablet    Refill:  0   amphetamine-dextroamphetamine (ADDERALL) 10 MG tablet    Sig: Take 1 tablet (10 mg total) by mouth 2 (two) times daily.    Dispense:  60 tablet    Refill:  0   amphetamine-dextroamphetamine (ADDERALL) 10 MG tablet    Sig: Take 1 tablet (10 mg total) by mouth 2 (two) times daily.    Dispense:  60 tablet    Refill:  0   amLODipine (NORVASC) 5 MG tablet    Sig: Take 1 tablet (5 mg total) by mouth daily.    Dispense:  90 tablet    Refill:  1   traZODone (DESYREL) 50 MG tablet    Sig: Take 0.5-1 tablets (25-50 mg total) by mouth at bedtime as needed for sleep.    Dispense:  30 tablet    Refill:  2    Follow-up: Return in about 3 weeks (around 03/17/2021) for Nurse visit for BP .    Beatrice Lecher, MD

## 2021-03-18 ENCOUNTER — Other Ambulatory Visit: Payer: Self-pay

## 2021-03-18 ENCOUNTER — Ambulatory Visit (INDEPENDENT_AMBULATORY_CARE_PROVIDER_SITE_OTHER): Payer: Managed Care, Other (non HMO) | Admitting: Family Medicine

## 2021-03-18 VITALS — BP 144/81 | HR 82

## 2021-03-18 DIAGNOSIS — I10 Essential (primary) hypertension: Secondary | ICD-10-CM | POA: Diagnosis not present

## 2021-03-18 DIAGNOSIS — Z23 Encounter for immunization: Secondary | ICD-10-CM | POA: Diagnosis not present

## 2021-03-18 MED ORDER — VALSARTAN 320 MG PO TABS: 320.0000 mg | ORAL_TABLET | Freq: Every day | ORAL | 1 refills | Status: DC

## 2021-03-18 NOTE — Progress Notes (Signed)
Established Patient Office Visit  Subjective:  Patient ID: Jesus Kennedy, male    DOB: [birth year]  Age: 56 y.o. MRN: [id number]  CC:  Chief Complaint  Patient presents with   Hypertension   Immunizations    HPI Jesus Kennedy presents for blood pressure check. Denies chest pain shortness of breath or dizziness.   Last shingles vaccine.   Past Medical History:  Diagnosis Date   Arthritis    LEFT KNEE,R SHOULDER   Hypertension     Past Surgical History:  Procedure Laterality Date   APPENDECTOMY  2011   COLONOSCOPY     KNEE SURGERY     LEFT KNEE April 27 2017    Family History  Problem Relation Age of Onset   Depression Mother    Hyperlipidemia Mother    Hypertension Mother        father   COPD Mother        smoker   Hypothyroidism Father    Hypothyroidism Brother    Heart defect Daughter        Tetralogy of Fallot    Colon cancer Neg Hx    Colon polyps Neg Hx    Esophageal cancer Neg Hx    Prostate cancer Neg Hx    Rectal cancer Neg Hx    Stomach cancer Neg Hx     Social History   Socioeconomic History   Marital status: Married    Spouse name: Diane   Number of children: 1   Years of education: Not on file   Highest education level: Not on file  Occupational History   Occupation: Careers information officer    Comment: [employer]  Tobacco Use   Smoking status: Never   Smokeless tobacco: Never  Vaping Use   Vaping Use: Never used  Substance and Sexual Activity   Alcohol use: Yes    Comment: 3-4 X A WEEK   Drug use: No   Sexual activity: Yes    Partners: Female  Other Topics Concern   Not on file  Social History Narrative   No regular exercise but he does have a very physically active job.   Social Determinants of Health   Financial Resource Strain: Not on file  Food Insecurity: Not on file  Transportation Needs: Not on file  Physical Activity: Not on file  Stress: Not on file  Social Connections: Not on file  Intimate  Partner Violence: Not on file    Outpatient Medications Prior to Visit  Medication Sig Dispense Refill   AMBULATORY NON FORMULARY MEDICATION Take 2 tablets by mouth daily. Medication Name: focus factor     amLODipine (NORVASC) 5 MG tablet Take 1 tablet (5 mg total) by mouth daily. 90 tablet 1   [START ON 04/22/2021] amphetamine-dextroamphetamine (ADDERALL) 10 MG tablet Take 1 tablet (10 mg total) by mouth 2 (two) times daily. 60 tablet 0   [START ON 03/23/2021] amphetamine-dextroamphetamine (ADDERALL) 10 MG tablet Take 1 tablet (10 mg total) by mouth 2 (two) times daily. 60 tablet 0   amphetamine-dextroamphetamine (ADDERALL) 10 MG tablet Take 1 tablet (10 mg total) by mouth 2 (two) times daily. 60 tablet 0   B Complex-Biotin-FA (SUPER B-COMPLEX PO) Take by mouth.     celecoxib (CELEBREX) 100 MG capsule Take 1 capsule (100 mg total) by mouth 2 (two) times daily. 180 capsule 1   Coenzyme Q10 (CO Q 10 PO) Take by mouth.     Misc Natural Products (OSTEO BI-FLEX JOINT SHIELD PO)  Take by mouth.     Multiple Vitamin (MULTIVITAMIN) tablet Take 1 tablet by mouth daily.     Omega-3 Fatty Acids (FISH OIL PO) Take by mouth daily.     traZODone (DESYREL) 50 MG tablet Take 0.5-1 tablets (25-50 mg total) by mouth at bedtime as needed for sleep. 30 tablet 2   losartan (COZAAR) 100 MG tablet Take 1 tablet by mouth once daily 90 tablet 0   losartan-hydrochlorothiazide (HYZAAR) 100-25 MG tablet Take 1 tablet by mouth daily.     No facility-administered medications prior to visit.    Allergies  Allergen Reactions   Penicillins Rash   Hydrochlorothiazide Other (See Comments)    ROS Review of Systems    Objective:    Physical Exam  BP (!) 144/81    Pulse 82    SpO2 97%  Wt Readings from Last 3 Encounters:  02/24/21 254 lb (115.2 kg)  08/22/20 252 lb (114.3 kg)  02/23/20 248 lb (112.5 kg)     Health Maintenance Due  Topic Date Due   Hepatitis C Screening  Never done   COVID-19 Vaccine (3 -  Booster for Janssen series) 11/14/2020    There are no preventive care reminders to display for this patient.  Lab Results  Component Value Date   TSH 4.14 08/05/2015   Lab Results  Component Value Date   WBC 5.9 08/05/2015   HGB 15.5 08/05/2015   HCT 45.1 08/05/2015   MCV 95.1 08/05/2015   PLT 155 08/05/2015   Lab Results  Component Value Date   NA 139 05/29/2019   K 4.5 05/29/2019   CO2 26 05/29/2019   GLUCOSE 92 05/29/2019   BUN 17 05/29/2019   CREATININE 1.00 05/29/2019   BILITOT 1.0 05/29/2019   ALKPHOS 43 08/05/2015   AST 42 (H) 05/29/2019   ALT 74 (H) 05/29/2019   PROT 7.4 05/29/2019   ALBUMIN 4.6 08/05/2015   CALCIUM 10.2 05/29/2019   Lab Results  Component Value Date   CHOL 221 (H) 11/28/2018   Lab Results  Component Value Date   HDL 51 11/28/2018   Lab Results  Component Value Date   LDLCALC 142 (H) 11/28/2018   Lab Results  Component Value Date   TRIG 149 11/28/2018   Lab Results  Component Value Date   CHOLHDL 4.3 11/28/2018   Lab Results  Component Value Date   HGBA1C 4.8 05/29/2019      Assessment & Plan:  Hypertension - Per Dr Madilyn Fireman, patient advised to stop the Losartan and start Valsartan. Also continue taking Amlodipine. Follow up in 2 weeks for nurse visit blood pressure check.   Problem List Items Addressed This Visit     Essential hypertension, benign   Other Visit Diagnoses     Need for shingles vaccine    -  Primary   Relevant Orders   Varicella-zoster vaccine IM (Shingrix) (Completed)       No orders of the defined types were placed in this encounter.   Follow-up: Return in about 2 weeks (around 04/01/2021) for nurse visit blood pressure check. Durene Romans, Monico Blitz, [place]

## 2021-03-18 NOTE — Progress Notes (Signed)
We will switch to valsartan hand to see if we can get a little bit more drop in blood pressure.  If not at goal then consider switching to [facility] or possibly increasing the amlodipine to 10 mg.

## 2021-03-26 ENCOUNTER — Other Ambulatory Visit: Payer: Self-pay | Admitting: Family Medicine

## 2021-04-01 ENCOUNTER — Other Ambulatory Visit: Payer: Self-pay

## 2021-04-01 ENCOUNTER — Ambulatory Visit (INDEPENDENT_AMBULATORY_CARE_PROVIDER_SITE_OTHER): Payer: Managed Care, Other (non HMO) | Admitting: Family Medicine

## 2021-04-01 VITALS — BP 130/77 | HR 69

## 2021-04-01 DIAGNOSIS — I10 Essential (primary) hypertension: Secondary | ICD-10-CM

## 2021-04-01 NOTE — Progress Notes (Signed)
? ?Established Patient Office Visit ? ?Subjective:  ?Patient ID: Jesus Kennedy, male    DOB: Aug 11, 1965  Age: 56 y.o. MRN: [id number] ? ?CC:  ?Chief Complaint  ?Patient presents with  ? Hypertension  ? ? ?HPI ?Jesus Kennedy presents for blood pressure check. Denies chest pain, shortness of breath or dizziness.  ? ?Past Medical History:  ?Diagnosis Date  ? Arthritis   ? LEFT KNEE,R SHOULDER  ? Hypertension   ? ? ?Past Surgical History:  ?Procedure Laterality Date  ? APPENDECTOMY  2011  ? COLONOSCOPY    ? KNEE SURGERY    ? LEFT KNEE April 27 2017  ? ? ?Family History  ?Problem Relation Age of Onset  ? Depression Mother   ? Hyperlipidemia Mother   ? Hypertension Mother   ?     father  ? COPD Mother   ?     smoker  ? Hypothyroidism Father   ? Hypothyroidism Brother   ? Heart defect Daughter   ?     Tetralogy of Fallot   ? Colon cancer Neg Hx   ? Colon polyps Neg Hx   ? Esophageal cancer Neg Hx   ? Prostate cancer Neg Hx   ? Rectal cancer Neg Hx   ? Stomach cancer Neg Hx   ? ? ?Social History  ? ?Socioeconomic History  ? Marital status: Married  ?  Spouse name: Diane  ? Number of children: 1  ? Years of education: Not on file  ? Highest education level: Not on file  ?Occupational History  ? Occupation: Careers information officer  ?  Comment: [employer]  ?Tobacco Use  ? Smoking status: Never  ? Smokeless tobacco: Never  ?Vaping Use  ? Vaping Use: Never used  ?Substance and Sexual Activity  ? Alcohol use: Yes  ?  Comment: 3-4 X A WEEK  ? Drug use: No  ? Sexual activity: Yes  ?  Partners: Female  ?Other Topics Concern  ? Not on file  ?Social History Narrative  ? No regular exercise but he does have a very physically active job.  ? ?Social Determinants of Health  ? ?Financial Resource Strain: Not on file  ?Food Insecurity: Not on file  ?Transportation Needs: Not on file  ?Physical Activity: Not on file  ?Stress: Not on file  ?Social Connections: Not on file  ?Intimate Partner Violence: Not on file  ? ? ?Outpatient  Medications Prior to Visit  ?Medication Sig Dispense Refill  ? AMBULATORY NON FORMULARY MEDICATION Take 2 tablets by mouth daily. Medication Name: focus factor    ? amLODipine (NORVASC) 5 MG tablet Take 1 tablet (5 mg total) by mouth daily. 90 tablet 1  ? [START ON 04/22/2021] amphetamine-dextroamphetamine (ADDERALL) 10 MG tablet Take 1 tablet (10 mg total) by mouth 2 (two) times daily. 60 tablet 0  ? amphetamine-dextroamphetamine (ADDERALL) 10 MG tablet Take 1 tablet (10 mg total) by mouth 2 (two) times daily. 60 tablet 0  ? amphetamine-dextroamphetamine (ADDERALL) 10 MG tablet Take 1 tablet (10 mg total) by mouth 2 (two) times daily. 60 tablet 0  ? B Complex-Biotin-FA (SUPER B-COMPLEX PO) Take by mouth.    ? celecoxib (CELEBREX) 100 MG capsule Take 1 capsule (100 mg total) by mouth 2 (two) times daily. 180 capsule 1  ? Coenzyme Q10 (CO Q 10 PO) Take by mouth.    ? Misc Natural Products (OSTEO BI-FLEX JOINT SHIELD PO) Take by mouth.    ? Multiple Vitamin (MULTIVITAMIN) tablet  Take 1 tablet by mouth daily.    ? Omega-3 Fatty Acids (FISH OIL PO) Take by mouth daily.    ? traZODone (DESYREL) 50 MG tablet Take 0.5-1 tablets (25-50 mg total) by mouth at bedtime as needed for sleep. 30 tablet 2  ? valsartan (DIOVAN) 320 MG tablet Take 1 tablet (320 mg total) by mouth daily. 30 tablet 1  ? ?No facility-administered medications prior to visit.  ? ? ?Allergies  ?Allergen Reactions  ? Penicillins Rash  ? Hydrochlorothiazide Other (See Comments)  ? ? ?ROS ?Review of Systems ? ?  ?Objective:  ?  ?Physical Exam ? ?BP 130/77   Pulse 69   SpO2 98%  ?Wt Readings from Last 3 Encounters:  ?02/24/21 254 lb (115.2 kg)  ?08/22/20 252 lb (114.3 kg)  ?02/23/20 248 lb (112.5 kg)  ? ? ? ?Health Maintenance Due  ?Topic Date Due  ? Hepatitis C Screening  Never done  ? COVID-19 Vaccine (3 - Booster for Janssen series) 11/14/2020  ? ? ?There are no preventive care reminders to display for this patient. ? ?Lab Results  ?Component Value Date  ?  TSH 4.14 08/05/2015  ? ?Lab Results  ?Component Value Date  ? WBC 5.9 08/05/2015  ? HGB 15.5 08/05/2015  ? HCT 45.1 08/05/2015  ? MCV 95.1 08/05/2015  ? PLT 155 08/05/2015  ? ?Lab Results  ?Component Value Date  ? NA 139 05/29/2019  ? K 4.5 05/29/2019  ? CO2 26 05/29/2019  ? GLUCOSE 92 05/29/2019  ? BUN 17 05/29/2019  ? CREATININE 1.00 05/29/2019  ? BILITOT 1.0 05/29/2019  ? ALKPHOS 43 08/05/2015  ? AST 42 (H) 05/29/2019  ? ALT 74 (H) 05/29/2019  ? PROT 7.4 05/29/2019  ? ALBUMIN 4.6 08/05/2015  ? CALCIUM 10.2 05/29/2019  ? ?Lab Results  ?Component Value Date  ? CHOL 221 (H) 11/28/2018  ? ?Lab Results  ?Component Value Date  ? HDL 51 11/28/2018  ? ?Lab Results  ?Component Value Date  ? LDLCALC 142 (H) 11/28/2018  ? ?Lab Results  ?Component Value Date  ? TRIG 149 11/28/2018  ? ?Lab Results  ?Component Value Date  ? CHOLHDL 4.3 11/28/2018  ? ?Lab Results  ?Component Value Date  ? HGBA1C 4.8 05/29/2019  ? ? ?  ?Assessment & Plan:  ?Hypertension - Second check of blood pressure was within normal limits. Patient advised to continue with current medications as directed. Follow up in 3 months with Dr Madilyn Fireman for blood pressure.  ? ?Problem List Items Addressed This Visit   ? ? Essential hypertension, benign - Primary  ? ? ?No orders of the defined types were placed in this encounter. ? ? ?Follow-up: Return in about 3 months (around 07/02/2021) for with Dr Madilyn Fireman for blood pressure. .  ? ? ?Lavell Luster, [place] ?

## 2021-04-01 NOTE — Progress Notes (Signed)
Agree with documentation as above.   Ciaran Begay, MD  

## 2021-05-28 ENCOUNTER — Other Ambulatory Visit: Payer: Self-pay | Admitting: Family Medicine

## 2021-06-25 ENCOUNTER — Other Ambulatory Visit: Payer: Self-pay | Admitting: *Deleted

## 2021-06-25 DIAGNOSIS — F988 Other specified behavioral and emotional disorders with onset usually occurring in childhood and adolescence: Secondary | ICD-10-CM

## 2021-06-25 MED ORDER — VALSARTAN 320 MG PO TABS: 320.0000 mg | ORAL_TABLET | Freq: Every day | ORAL | 0 refills | Status: DC

## 2021-06-25 MED ORDER — AMPHETAMINE-DEXTROAMPHETAMINE 10 MG PO TABS: 10.0000 mg | ORAL_TABLET | Freq: Two times a day (BID) | ORAL | 0 refills | Status: DC

## 2021-06-30 ENCOUNTER — Encounter: Payer: Self-pay | Admitting: Family Medicine

## 2021-06-30 ENCOUNTER — Ambulatory Visit: Payer: Managed Care, Other (non HMO) | Admitting: Family Medicine

## 2021-06-30 VITALS — BP 136/69 | HR 76 | Ht 68.0 in | Wt 250.0 lb

## 2021-06-30 DIAGNOSIS — F988 Other specified behavioral and emotional disorders with onset usually occurring in childhood and adolescence: Secondary | ICD-10-CM | POA: Diagnosis not present

## 2021-06-30 DIAGNOSIS — I10 Essential (primary) hypertension: Secondary | ICD-10-CM | POA: Diagnosis not present

## 2021-06-30 MED ORDER — AMPHETAMINE-DEXTROAMPHETAMINE 10 MG PO TABS: 10.0000 mg | ORAL_TABLET | Freq: Two times a day (BID) | ORAL | 0 refills | Status: DC

## 2021-06-30 NOTE — Progress Notes (Signed)
   Established Patient Office Visit  Subjective   Patient ID: Jesus Kennedy, male    DOB: 1965-10-09  Age: 56 y.o. MRN: [id number]  No chief complaint on file.   HPI  Hypertension- Pt denies chest pain, SOB, dizziness, or heart palpitations.  Taking meds as directed w/o problems.  Denies medication side effects.    ADD - Reports symptoms are well controlled on current regime. Denies any problems with insomnia, chest pain, palpitations, or SOB.       ROS    Objective:     BP 136/69   Pulse 76   Ht '5\' 8"'$  (1.727 m)   Wt 250 lb (113.4 kg)   SpO2 94%   BMI 38.01 kg/m    Physical Exam Constitutional:      Appearance: He is well-developed.  HENT:     Head: Normocephalic and atraumatic.  Cardiovascular:     Rate and Rhythm: Normal rate and regular rhythm.     Heart sounds: Normal heart sounds.  Pulmonary:     Effort: Pulmonary effort is normal.     Breath sounds: Normal breath sounds.  Skin:    General: Skin is warm and dry.  Neurological:     Mental Status: He is alert and oriented to person, place, and time.  Psychiatric:        Behavior: Behavior normal.     No results found for any visits on 06/30/21.    The 10-year ASCVD risk score (Arnett DK, et al., 2019) is: 6.7%    Assessment & Plan:   Problem List Items Addressed This Visit       Cardiovascular and Mediastinum   Essential hypertension - Primary    Well controlled. Continue current regimen. Follow up in  35mo Due for labs.         Other   ADD (attention deficit disorder)    Agree with documentation as above.   CBeatrice Lecher MD       Relevant Medications   amphetamine-dextroamphetamine (ADDERALL) 10 MG tablet (Start on 08/23/2021)   amphetamine-dextroamphetamine (ADDERALL) 10 MG tablet (Start on 07/24/2021)   Due for Tdap. Wants to wait until next week.    Return in about 6 months (around 12/30/2021) for Hypertension, ADD.    CBeatrice Lecher MD

## 2021-06-30 NOTE — Assessment & Plan Note (Signed)
Well controlled. Continue current regimen. Follow up in  6 mo. Due for labs.  

## 2021-06-30 NOTE — Assessment & Plan Note (Signed)
Agree with documentation as above.   Susan Arana, MD  

## 2021-07-01 LAB — CBC
HCT: 46.3 % (ref 38.5–50.0)
Hemoglobin: 15.9 g/dL (ref 13.2–17.1)
MCH: 32.6 pg (ref 27.0–33.0)
MCHC: 34.3 g/dL (ref 32.0–36.0)
MCV: 95.1 fL (ref 80.0–100.0)
MPV: 11 fL (ref 7.5–12.5)
Platelets: 138 10*3/uL — ABNORMAL LOW (ref 140–400)
RBC: 4.87 10*6/uL (ref 4.20–5.80)
RDW: 13.2 % (ref 11.0–15.0)
WBC: 4.1 10*3/uL (ref 3.8–10.8)

## 2021-07-01 LAB — HEMOGLOBIN A1C
Hgb A1c MFr Bld: 4.9 % of total Hgb (ref <5.7)
Mean Plasma Glucose: 94 mg/dL
eAG (mmol/L): 5.2 mmol/L

## 2021-07-01 LAB — COMPLETE METABOLIC PANEL WITH GFR
AG Ratio: 1.7 (calc) (ref 1.0–2.5)
ALT: 47 U/L — ABNORMAL HIGH (ref 9–46)
AST: 33 U/L (ref 10–35)
Albumin: 4.6 g/dL (ref 3.6–5.1)
Alkaline phosphatase (APISO): 47 U/L (ref 35–144)
BUN: 16 mg/dL (ref 7–25)
CO2: 23 mmol/L (ref 20–32)
Calcium: 9.7 mg/dL (ref 8.6–10.3)
Chloride: 101 mmol/L (ref 98–110)
Creat: 0.93 mg/dL (ref 0.70–1.30)
Globulin: 2.7 g/dL (calc) (ref 1.9–3.7)
Glucose, Bld: 97 mg/dL (ref 65–99)
Potassium: 4.3 mmol/L (ref 3.5–5.3)
Sodium: 136 mmol/L (ref 135–146)
Total Bilirubin: 0.9 mg/dL (ref 0.2–1.2)
Total Protein: 7.3 g/dL (ref 6.1–8.1)
eGFR: 98 mL/min/{1.73_m2} (ref >=60)

## 2021-07-01 LAB — LIPID PANEL W/REFLEX DIRECT LDL
Cholesterol: 217 mg/dL — ABNORMAL HIGH (ref <200)
HDL: 56 mg/dL (ref >=40)
LDL Cholesterol (Calc): 127 mg/dL (calc) — ABNORMAL HIGH
Non-HDL Cholesterol (Calc): 161 mg/dL (calc) — ABNORMAL HIGH (ref <130)
Total CHOL/HDL Ratio: 3.9 (calc) (ref <5.0)
Triglycerides: 207 mg/dL — ABNORMAL HIGH (ref <150)

## 2021-07-01 NOTE — Progress Notes (Signed)
Hi Jesus Kennedy, platelets are little borderline low.  You have had this in the past about 12 years ago.  But I do want to keep an eye on it and plan to recheck your CBC in about 4 to 6 weeks.  Your LDL cholesterol and total cholesterol look better this year great work and bringing that down!  The AST liver enzyme is back into the normal range which is awesome.  The ALT is trending downward but not quite back to normal but almost there.  Plan to recheck liver function in 6 months.  Just continue with healthy dietary changes and regular exercise.  A1c is normal.  No sign of diabetes or prediabetes.

## 2021-09-17 ENCOUNTER — Other Ambulatory Visit: Payer: Self-pay | Admitting: Family Medicine

## 2021-09-17 DIAGNOSIS — I10 Essential (primary) hypertension: Secondary | ICD-10-CM

## 2021-09-18 ENCOUNTER — Other Ambulatory Visit: Payer: Self-pay | Admitting: Family Medicine

## 2021-09-18 DIAGNOSIS — F988 Other specified behavioral and emotional disorders with onset usually occurring in childhood and adolescence: Secondary | ICD-10-CM

## 2021-09-19 MED ORDER — AMPHETAMINE-DEXTROAMPHETAMINE 10 MG PO TABS: 10.0000 mg | ORAL_TABLET | Freq: Two times a day (BID) | ORAL | 0 refills | Status: DC

## 2021-09-19 NOTE — Telephone Encounter (Signed)
Last office visit 06/30/2021  Last filled 07/24/2021

## 2021-10-02 ENCOUNTER — Ambulatory Visit
Admission: EM | Admit: 2021-10-02 | Discharge: 2021-10-02 | Disposition: A | Discharge status: Home / Self Care | Payer: Managed Care, Other (non HMO) | Attending: Family Medicine | Admitting: Family Medicine

## 2021-10-02 DIAGNOSIS — R21 Rash and other nonspecific skin eruption: Secondary | ICD-10-CM | POA: Diagnosis not present

## 2021-10-02 MED ORDER — PREDNISONE 10 MG (21) PO TBPK: ORAL_TABLET | Freq: Every day | ORAL | 0 refills | Status: DC

## 2021-10-02 MED ORDER — METHYLPREDNISOLONE SODIUM SUCC 125 MG IJ SOLR
125.0000 mg | Freq: Once | INTRAMUSCULAR | Status: AC
  Administered 2021-10-02: 125 mg via INTRAMUSCULAR

## 2021-10-02 NOTE — ED Triage Notes (Signed)
Pt presents with c/o rash on his left leg and arm that he describes as itching and burning. Pt states he noticed the rash yesterday.

## 2021-10-02 NOTE — Discharge Instructions (Addendum)
Patient to start medication tomorrow morning Friday, 10/03/2021.  Encouraged patient to take medication as directed with food to completion.  Encouraged patient to increase daily water intake while taking this medication.  Advised patient we will follow-up with lab results once received.  Advised patient if symptoms worsen and/or unresolved please follow-up with PCP or here for further evaluation.

## 2021-10-02 NOTE — ED Provider Notes (Signed)
[facility]    CSN: [id number] Arrival date & time: 10/02/21  1115      History   Chief Complaint Chief Complaint  Patient presents with   Rash    HPI BRYCE CHEEVER is a 56 y.o. male.   HPI 56 year old male presents with red itchy rash of multiple areas (lower legs arms, hand, back, chest (for 2 days.  Patient denies new bathing soaps, laundry soaps, detergents, emollients, new foods/beverages, exposure to new pets, and/or recent travels.  PMH significant for obesity, HTN, and HLD.  Past Medical History:  Diagnosis Date   Arthritis    LEFT KNEE,R SHOULDER   Hypertension     Patient Active Problem List   Diagnosis Date Noted   Adjustment insomnia 02/24/2021   BMI 37.0-37.9, adult 02/23/2020   Work stress 07/28/2018   Hyperlipidemia 05/18/2011   ADD (attention deficit disorder) 05/18/2011   Essential hypertension 04/30/2011   Arthralgia 04/30/2011    Past Surgical History:  Procedure Laterality Date   APPENDECTOMY  2011   COLONOSCOPY     KNEE SURGERY     LEFT KNEE April 27 2017       Home Medications    Prior to Admission medications   Medication Sig Start Date End Date Taking? Authorizing Provider  predniSONE (STERAPRED UNI-PAK 21 TAB) 10 MG (21) TBPK tablet Take by mouth daily. Take 6 tabs by mouth daily  for 2 days, then 5 tabs for 2 days, then 4 tabs for 2 days, then 3 tabs for 2 days, 2 tabs for 2 days, then 1 tab by mouth daily for 2 days 10/02/21  Yes Eliezer Lofts, FNP  AMBULATORY NON FORMULARY MEDICATION Take 2 tablets by mouth daily. Medication Name: focus factor    [provider]  amLODipine (NORVASC) 5 MG tablet Take 1 tablet by mouth once daily 09/18/21   Hali Marry, MD  amphetamine-dextroamphetamine (ADDERALL) 10 MG tablet Take 1 tablet (10 mg total) by mouth 2 (two) times daily. 06/25/21   Hali Marry, MD  amphetamine-dextroamphetamine (ADDERALL) 10 MG tablet Take 1 tablet (10 mg total) by mouth 2  (two) times daily. 07/24/21   Hali Marry, MD  amphetamine-dextroamphetamine (ADDERALL) 10 MG tablet Take 1 tablet (10 mg total) by mouth 2 (two) times daily. 09/19/21   Hali Marry, MD  B Complex-Biotin-FA (SUPER B-COMPLEX PO) Take by mouth.    [provider]  celecoxib (CELEBREX) 100 MG capsule Take 1 capsule by mouth twice daily 09/18/21   Hali Marry, MD  Coenzyme Q10 (CO Q 10 PO) Take by mouth.    [provider]  Misc Natural Products (OSTEO BI-FLEX JOINT SHIELD PO) Take by mouth.    [provider]  Multiple Vitamin (MULTIVITAMIN) tablet Take 1 tablet by mouth daily.    [provider]  Omega-3 Fatty Acids (FISH OIL PO) Take by mouth daily.    [provider]  traZODone (DESYREL) 50 MG tablet Take 0.5-1 tablets (25-50 mg total) by mouth at bedtime as needed for sleep. 02/24/21   Hali Marry, MD  valsartan (DIOVAN) 320 MG tablet Take 1 tablet (320 mg total) by mouth daily. 06/25/21   Hali Marry, MD    Family History Family History  Problem Relation Age of Onset   Depression Mother    Hyperlipidemia Mother    Hypertension Mother        father   COPD Mother  smoker   Hypothyroidism Father    Hypothyroidism Brother    Heart defect Daughter        Tetralogy of Fallot    Colon cancer Neg Hx    Colon polyps Neg Hx    Esophageal cancer Neg Hx    Prostate cancer Neg Hx    Rectal cancer Neg Hx    Stomach cancer Neg Hx     Social History Social History   Tobacco Use   Smoking status: Never   Smokeless tobacco: Never  Vaping Use   Vaping Use: Never used  Substance Use Topics   Alcohol use: Yes    Comment: 3-4 X A WEEK   Drug use: No     Allergies   Penicillins and Hydrochlorothiazide   Review of Systems Review of Systems  Skin:  Positive for rash.  All other systems reviewed and are negative.    Physical Exam Triage Vital Signs ED Triage Vitals  Enc Vitals Group      BP 10/02/21 1130 (!) 155/102     Pulse Rate 10/02/21 1130 70     Resp 10/02/21 1130 14     Temp 10/02/21 1130 98.3 F (36.8 C)     Temp Source 10/02/21 1130 Oral     SpO2 10/02/21 1130 96 %     Weight --      Height --      Head Circumference --      Peak Flow --      Pain Score 10/02/21 1129 0     Pain Loc --      Pain Edu? --      Excl. in [place]? --    No data found.  Updated Vital Signs BP (!) 155/102 (BP Location: Left Arm)   Pulse 70   Temp 98.3 F (36.8 C) (Oral)   Resp 14   SpO2 96%       Physical Exam Vitals and nursing note reviewed.  Constitutional:      General: He is not in acute distress.    Appearance: Normal appearance. He is normal weight. He is not ill-appearing.  HENT:     Head: Normocephalic and atraumatic.     Mouth/Throat:     Mouth: Mucous membranes are moist.     Pharynx: Oropharynx is clear.  Eyes:     Extraocular Movements: Extraocular movements intact.     Conjunctiva/sclera: Conjunctivae normal.     Pupils: Pupils are equal, round, and reactive to light.  Cardiovascular:     Rate and Rhythm: Normal rate and regular rhythm.     Pulses: Normal pulses.     Heart sounds: Normal heart sounds.  Pulmonary:     Effort: Pulmonary effort is normal.     Breath sounds: Normal breath sounds. No wheezing, rhonchi or rales.  Musculoskeletal:        General: Normal range of motion.     Cervical back: Normal range of motion and neck supple.  Skin:    General: Skin is warm and dry.     Comments: Multiple dermatomes: Erythematous, maculopapular eruption-please see images below  Neurological:     General: No focal deficit present.     Mental Status: He is alert and oriented to person, place, and time.                 UC Treatments / Results  Labs (all labs ordered are listed, but only abnormal results are displayed) Labs Reviewed  CBC WITH DIFFERENTIAL/PLATELET  COMPLETE METABOLIC PANEL WITH GFR    EKG   Radiology No results  found.  Procedures Procedures (including critical care time)  Medications Ordered in UC Medications  methylPREDNISolone sodium succinate (SOLU-MEDROL) 125 mg/2 mL injection 125 mg (125 mg Intramuscular Given 10/02/21 1325)    Initial Impression / Assessment and Plan / UC Course  I have reviewed the triage vital signs and the nursing notes.  Pertinent labs & imaging results that were available during my care of the patient were reviewed by me and considered in my medical decision making (see chart for details).     MDM: 1.  Rash and nonspecific skin eruption-IM Solu-Medrol 125 mg given once in clinic prior to discharge, Rx'd Sterapred Unipak. Patient to start medication tomorrow morning Friday, 10/03/2021.  Encouraged patient to take medication as directed with food to completion.  Encouraged patient to increase daily water intake while taking this medication.  Advised patient we will follow-up with lab results once received.  Advised patient if symptoms worsen and/or unresolved please follow-up with PCP or here for further evaluation.  Patient discharged home, hemodynamically stable. Final Clinical Impressions(s) / UC Diagnoses   Final diagnoses:  Rash and nonspecific skin eruption     Discharge Instructions      Patient to start medication tomorrow morning Friday, 10/03/2021.  Encouraged patient to take medication as directed with food to completion.  Encouraged patient to increase daily water intake while taking this medication.  Advised patient we will follow-up with lab results once received.  Advised patient if symptoms worsen and/or unresolved please follow-up with PCP or here for further evaluation.     ED Prescriptions     Medication Sig Dispense Auth. Provider   predniSONE (STERAPRED UNI-PAK 21 TAB) 10 MG (21) TBPK tablet Take by mouth daily. Take 6 tabs by mouth daily  for 2 days, then 5 tabs for 2 days, then 4 tabs for 2 days, then 3 tabs for 2 days, 2 tabs for 2 days,  then 1 tab by mouth daily for 2 days 42 tablet Eliezer Lofts, FNP      PDMP not reviewed this encounter.   Eliezer Lofts, [place] 10/02/21 1336

## 2021-10-03 LAB — COMPLETE METABOLIC PANEL WITH GFR
AG Ratio: 1.6 (calc) (ref 1.0–2.5)
ALT: 81 U/L — ABNORMAL HIGH (ref 9–46)
AST: 66 U/L — ABNORMAL HIGH (ref 10–35)
Albumin: 4.5 g/dL (ref 3.6–5.1)
Alkaline phosphatase (APISO): 50 U/L (ref 35–144)
BUN: 17 mg/dL (ref 7–25)
CO2: 19 mmol/L — ABNORMAL LOW (ref 20–32)
Calcium: 9.5 mg/dL (ref 8.6–10.3)
Chloride: 105 mmol/L (ref 98–110)
Creat: 0.98 mg/dL (ref 0.70–1.30)
Globulin: 2.9 g/dL (calc) (ref 1.9–3.7)
Glucose, Bld: 96 mg/dL (ref 65–99)
Potassium: 4.1 mmol/L (ref 3.5–5.3)
Sodium: 137 mmol/L (ref 135–146)
Total Bilirubin: 0.9 mg/dL (ref 0.2–1.2)
Total Protein: 7.4 g/dL (ref 6.1–8.1)
eGFR: 92 mL/min/{1.73_m2} (ref >=60)

## 2021-10-03 LAB — CBC WITH DIFFERENTIAL/PLATELET
Absolute Monocytes: 409 cells/uL (ref 200–950)
Basophils Absolute: 40 cells/uL (ref 0–200)
Basophils Relative: 0.9 %
Eosinophils Absolute: 141 cells/uL (ref 15–500)
Eosinophils Relative: 3.2 %
HCT: 45.5 % (ref 38.5–50.0)
Hemoglobin: 15.9 g/dL (ref 13.2–17.1)
Lymphs Abs: 1153 cells/uL (ref 850–3900)
MCH: 33.7 pg — ABNORMAL HIGH (ref 27.0–33.0)
MCHC: 34.9 g/dL (ref 32.0–36.0)
MCV: 96.4 fL (ref 80.0–100.0)
MPV: 11.2 fL (ref 7.5–12.5)
Monocytes Relative: 9.3 %
Neutro Abs: 2658 cells/uL (ref 1500–7800)
Neutrophils Relative %: 60.4 %
Platelets: 123 10*3/uL — ABNORMAL LOW (ref 140–400)
RBC: 4.72 10*6/uL (ref 4.20–5.80)
RDW: 13 % (ref 11.0–15.0)
Total Lymphocyte: 26.2 %
WBC: 4.4 10*3/uL (ref 3.8–10.8)

## 2021-10-13 ENCOUNTER — Other Ambulatory Visit: Payer: Self-pay | Admitting: Family Medicine

## 2021-11-12 ENCOUNTER — Other Ambulatory Visit: Payer: Self-pay | Admitting: Family Medicine

## 2021-11-12 DIAGNOSIS — F988 Other specified behavioral and emotional disorders with onset usually occurring in childhood and adolescence: Secondary | ICD-10-CM

## 2021-11-12 MED ORDER — AMPHETAMINE-DEXTROAMPHETAMINE 10 MG PO TABS: 10.0000 mg | ORAL_TABLET | Freq: Two times a day (BID) | ORAL | 0 refills | Status: DC

## 2021-12-30 ENCOUNTER — Ambulatory Visit: Payer: Managed Care, Other (non HMO) | Admitting: Family Medicine

## 2021-12-30 ENCOUNTER — Encounter: Payer: Self-pay | Admitting: Family Medicine

## 2021-12-30 VITALS — BP 144/88 | HR 62 | Ht 68.0 in | Wt 256.0 lb

## 2021-12-30 DIAGNOSIS — I10 Essential (primary) hypertension: Secondary | ICD-10-CM

## 2021-12-30 DIAGNOSIS — R748 Abnormal levels of other serum enzymes: Secondary | ICD-10-CM

## 2021-12-30 DIAGNOSIS — F988 Other specified behavioral and emotional disorders with onset usually occurring in childhood and adolescence: Secondary | ICD-10-CM

## 2021-12-30 DIAGNOSIS — K760 Fatty (change of) liver, not elsewhere classified: Secondary | ICD-10-CM | POA: Insufficient documentation

## 2021-12-30 MED ORDER — AMPHETAMINE-DEXTROAMPHETAMINE 10 MG PO TABS: 10.0000 mg | ORAL_TABLET | Freq: Two times a day (BID) | ORAL | 0 refills | Status: DC

## 2021-12-30 MED ORDER — AMLODIPINE BESYLATE 5 MG PO TABS: 5.0000 mg | ORAL_TABLET | Freq: Every day | ORAL | 0 refills | Status: DC

## 2021-12-30 MED ORDER — AMLODIPINE BESYLATE 10 MG PO TABS: 10.0000 mg | ORAL_TABLET | Freq: Every day | ORAL | 1 refills | Status: DC

## 2021-12-30 NOTE — Progress Notes (Signed)
Established Patient Office Visit  Subjective   Patient ID: Jesus Kennedy, male    DOB: November 11, 1965  Age: 56 y.o. MRN: [id number]  Chief Complaint  Patient presents with   Hypertension   ADD    HPI Hypertension- Pt denies chest pain, SOB, dizziness, or heart palpitations.  Taking meds as directed w/o problems.  Denies medication side effects.  Has a stressful day at work today and just took his medications right before he came.  ADD - Reports symptoms are well controlled on current regime. Denies any problems with insomnia, chest pain, palpitations, or SOB.    Seen at [facility] on 9/14 for rash and liver enzymes were elevated more than normal.     ROS    Objective:     BP (!) 144/88   Pulse 62   Ht '5\' 8"'$  (1.727 m)   Wt 256 lb (116.1 kg)   SpO2 97%   BMI 38.92 kg/m    Physical Exam Constitutional:      Appearance: He is well-developed.  HENT:     Head: Normocephalic and atraumatic.  Cardiovascular:     Rate and Rhythm: Normal rate and regular rhythm.     Heart sounds: Normal heart sounds.  Pulmonary:     Effort: Pulmonary effort is normal.     Breath sounds: Normal breath sounds.  Skin:    General: Skin is warm and dry.  Neurological:     Mental Status: He is alert and oriented to person, place, and time.  Psychiatric:        Behavior: Behavior normal.     No results found for any visits on 12/30/21.    The 10-year ASCVD risk score (Arnett DK, et al., 2019) is: 7.3%    Assessment & Plan:   Problem List Items Addressed This Visit       Cardiovascular and Mediastinum   Essential hypertension - Primary    Blood pressure is still elevated but not as high as it was back in June but it was still mildly elevated in doing so.  Increase amlodipine to 10 mg.  New prescription sent to pharmacy.  Follow-up in 6 months.  Continue with valsartan 320 mg      Relevant Medications   amLODipine (NORVASC) 10 MG tablet   Other Relevant Orders   COMPLETE METABOLIC  PANEL WITH GFR     Digestive   Fatty liver    Discussed dx. Work on weight loss, healthy diet and inc activity. He does have a physically active job.  Head CT about 12 years ago when he had appendicitis and the noted fatty liver at that point in time.  Will get updated ultrasound since it has been 10 years since his last 1.  He also recently quit drinking alcohol.  We discussed the goal of getting down to about 180 to 190 pounds.      Relevant Orders   COMPLETE METABOLIC PANEL WITH GFR   Hepatitis, Acute   [place] ELASTOGRAPHY LIVER     Other   ADD (attention deficit disorder)    Well controlled. Continue current regimen. Follow up in  6 mo       Relevant Medications   amphetamine-dextroamphetamine (ADDERALL) 10 MG tablet   amphetamine-dextroamphetamine (ADDERALL) 10 MG tablet (Start on 01/29/2022)   amphetamine-dextroamphetamine (ADDERALL) 10 MG tablet (Start on 02/28/2022)   Other Visit Diagnoses     Elevated liver enzymes       Relevant Orders   COMPLETE METABOLIC  PANEL WITH GFR   Hepatitis, Acute   [place] ELASTOGRAPHY LIVER   Essential hypertension, benign       Relevant Medications   amLODipine (NORVASC) 10 MG tablet      Declined Tdap today.   Return in about 6 months (around 07/01/2022) for Hypertension.    Beatrice Lecher, MD

## 2021-12-30 NOTE — Assessment & Plan Note (Signed)
Well controlled. Continue current regimen. Follow up in  6 mo  

## 2021-12-30 NOTE — Assessment & Plan Note (Addendum)
Discussed dx. Work on weight loss, healthy diet and inc activity. He does have a physically active job.  Head CT about 12 years ago when he had appendicitis and the noted fatty liver at that point in time.  Will get updated ultrasound since it has been 10 years since his last 1.  He also recently quit drinking alcohol.  We discussed the goal of getting down to about 180 to 190 pounds.

## 2021-12-30 NOTE — Assessment & Plan Note (Signed)
Blood pressure is still elevated but not as high as it was back in June but it was still mildly elevated in doing so.  Increase amlodipine to 10 mg.  New prescription sent to pharmacy.  Follow-up in 6 months.  Continue with valsartan 320 mg

## 2021-12-31 LAB — COMPLETE METABOLIC PANEL WITH GFR
AG Ratio: 1.8 (calc) (ref 1.0–2.5)
ALT: 60 U/L — ABNORMAL HIGH (ref 9–46)
AST: 33 U/L (ref 10–35)
Albumin: 4.5 g/dL (ref 3.6–5.1)
Alkaline phosphatase (APISO): 44 U/L (ref 35–144)
BUN: 15 mg/dL (ref 7–25)
CO2: 24 mmol/L (ref 20–32)
Calcium: 9.3 mg/dL (ref 8.6–10.3)
Chloride: 105 mmol/L (ref 98–110)
Creat: 0.94 mg/dL (ref 0.70–1.30)
Globulin: 2.5 g/dL (calc) (ref 1.9–3.7)
Glucose, Bld: 97 mg/dL (ref 65–99)
Potassium: 4.1 mmol/L (ref 3.5–5.3)
Sodium: 139 mmol/L (ref 135–146)
Total Bilirubin: 0.7 mg/dL (ref 0.2–1.2)
Total Protein: 7 g/dL (ref 6.1–8.1)
eGFR: 96 mL/min/{1.73_m2} (ref >=60)

## 2021-12-31 LAB — HEPATITIS PANEL, ACUTE
Hep A IgM: NONREACTIVE
Hep B C IgM: NONREACTIVE
Hepatitis B Surface Ag: NONREACTIVE
Hepatitis C Ab: NONREACTIVE

## 2021-12-31 NOTE — Progress Notes (Signed)
HI Jesus Kennedy, great news!  Your AST liver test is back to normal.  And the ALT is trended down by about 21 points.  Just continue to work on those healthy changes and lets plan to recheck it again in 3 months.  Should contact you soon to schedule the liver ultrasound.

## 2021-12-31 NOTE — Progress Notes (Signed)
No acute hepatitis

## 2022-01-26 ENCOUNTER — Other Ambulatory Visit: Payer: Self-pay | Admitting: Family Medicine

## 2022-01-26 MED ORDER — VALSARTAN 320 MG PO TABS: 320.0000 mg | ORAL_TABLET | Freq: Every day | ORAL | 0 refills | Status: DC

## 2022-03-02 ENCOUNTER — Other Ambulatory Visit: Payer: Self-pay | Admitting: Family Medicine

## 2022-03-02 DIAGNOSIS — F988 Other specified behavioral and emotional disorders with onset usually occurring in childhood and adolescence: Secondary | ICD-10-CM

## 2022-03-02 MED ORDER — CELECOXIB 100 MG PO CAPS: 100.0000 mg | ORAL_CAPSULE | Freq: Two times a day (BID) | ORAL | 0 refills | Status: DC

## 2022-05-06 ENCOUNTER — Encounter: Payer: Self-pay | Admitting: Family Medicine

## 2022-05-06 ENCOUNTER — Other Ambulatory Visit: Payer: Self-pay | Admitting: Family Medicine

## 2022-05-06 DIAGNOSIS — F988 Other specified behavioral and emotional disorders with onset usually occurring in childhood and adolescence: Secondary | ICD-10-CM

## 2022-05-06 MED ORDER — AMPHETAMINE-DEXTROAMPHETAMINE 10 MG PO TABS: 10.0000 mg | ORAL_TABLET | Freq: Two times a day (BID) | ORAL | 0 refills | Status: DC

## 2022-05-06 MED ORDER — VALSARTAN 320 MG PO TABS: 320.0000 mg | ORAL_TABLET | Freq: Every day | ORAL | 0 refills | Status: DC

## 2022-05-28 ENCOUNTER — Other Ambulatory Visit: Payer: Self-pay | Admitting: Family Medicine

## 2022-05-28 DIAGNOSIS — F988 Other specified behavioral and emotional disorders with onset usually occurring in childhood and adolescence: Secondary | ICD-10-CM

## 2022-05-28 MED ORDER — AMPHETAMINE-DEXTROAMPHETAMINE 10 MG PO TABS: 10.0000 mg | ORAL_TABLET | Freq: Two times a day (BID) | ORAL | 0 refills | Status: DC

## 2022-05-28 NOTE — Telephone Encounter (Signed)
Patient called requesting a refill of amphetamine-dextroamphetamine (ADDERALL) 10 MG tablet [[id number]]   The patients insurance runs expires at 5:00 pm this afternoon (05/28/22).  Pharmacy -  [id number] - [place], [place] - [address]

## 2022-05-28 NOTE — Telephone Encounter (Signed)
Meds ordered this encounter  Medications  . amphetamine-dextroamphetamine (ADDERALL) 10 MG tablet    Sig: Take 1 tablet (10 mg total) by mouth 2 (two) times daily.    Dispense:  60 tablet    Refill:  0    

## 2022-05-28 NOTE — Telephone Encounter (Signed)
Sorry to hear that. Did he say that anything about getting new insurance/job?

## 2022-07-01 ENCOUNTER — Ambulatory Visit: Payer: Managed Care, Other (non HMO) | Admitting: Family Medicine

## 2022-07-24 ENCOUNTER — Ambulatory Visit (INDEPENDENT_AMBULATORY_CARE_PROVIDER_SITE_OTHER): Payer: Commercial Managed Care - PPO | Admitting: Sports Medicine

## 2022-07-24 ENCOUNTER — Ambulatory Visit (INDEPENDENT_AMBULATORY_CARE_PROVIDER_SITE_OTHER): Payer: Commercial Managed Care - PPO

## 2022-07-24 DIAGNOSIS — Z0189 Encounter for other specified special examinations: Secondary | ICD-10-CM

## 2022-07-24 DIAGNOSIS — S83241A Other tear of medial meniscus, current injury, right knee, initial encounter: Secondary | ICD-10-CM

## 2022-07-24 DIAGNOSIS — S83206A Unspecified tear of unspecified meniscus, current injury, right knee, initial encounter: Secondary | ICD-10-CM | POA: Insufficient documentation

## 2022-07-24 MED ORDER — TRAMADOL HCL 50 MG PO TABS: 50.0000 mg | ORAL_TABLET | Freq: Three times a day (TID) | ORAL | 0 refills | Status: DC | PRN

## 2022-07-24 NOTE — Progress Notes (Addendum)
    Procedures performed today:    None.  Independent interpretation of notes and tests performed by another provider:   None.  Brief History, Exam, Impression, and Recommendations:    Right knee meniscal tear Pleasant 57 year old male, took a misstep several days ago, immediate sharp pain medial knee, the knee then walked, he is unable to extend and has approximately 20 degrees of extension lag. He has a history of a meniscal tear on the left knee, he is post 80% meniscectomy. On exam he has tenderness medial joint line, positive McMurray's sign, ligaments are stable. Due to a locked knee we will proceed with x-rays and an MRI, tramadol for pain, I would like to go and get him teed up with a referral to Dr. Everardo Pacific. He does understand the limited ability of arthroscopy intervention to address pain from osteoarthritis, so he does understand that he will likely have some discomfort even after a successful arthroscopy. If he does get an arthroscopy we will proceed with additional nonsurgical measures including steroid injections, viscosupplementation, PRP, genicular artery embolization, physical therapy in hopes of controlling his pain.    ____________________________________________ Ihor Austin. Benjamin Stain, M.D., ABFM., CAQSM., AME. Primary Care and Sports Medicine [facility]  Adjunct Professor of Family Medicine  [place] [employer] [facility] [employer]  Restaurant manager, fast food

## 2022-07-24 NOTE — Assessment & Plan Note (Addendum)
Pleasant 57 year old male, took a misstep several days ago, immediate sharp pain medial knee, the knee then walked, he is unable to extend and has approximately 20 degrees of extension lag. He has a history of a meniscal tear on the left knee, he is post 80% meniscectomy. On exam he has tenderness medial joint line, positive McMurray's sign, ligaments are stable. Due to a locked knee we will proceed with x-rays and an MRI, tramadol for pain, I would like to go and get him teed up with a referral to Dr. Everardo Pacific. He does understand the limited ability of arthroscopy intervention to address pain from osteoarthritis, so he does understand that he will likely have some discomfort even after a successful arthroscopy. If he does get an arthroscopy we will proceed with additional nonsurgical measures including steroid injections, viscosupplementation, PRP, genicular artery embolization, physical therapy in hopes of controlling his pain.

## 2022-07-25 ENCOUNTER — Ambulatory Visit (INDEPENDENT_AMBULATORY_CARE_PROVIDER_SITE_OTHER): Payer: Commercial Managed Care - PPO

## 2022-07-25 DIAGNOSIS — S83241A Other tear of medial meniscus, current injury, right knee, initial encounter: Secondary | ICD-10-CM

## 2022-08-07 ENCOUNTER — Other Ambulatory Visit: Payer: Self-pay

## 2022-08-07 ENCOUNTER — Encounter (HOSPITAL_BASED_OUTPATIENT_CLINIC_OR_DEPARTMENT_OTHER): Payer: Self-pay | Admitting: Orthopaedic Surgery

## 2022-08-10 NOTE — H&P (Signed)
PREOPERATIVE H&P  Chief Complaint: right knee medial meniscus injury, synovitis  HPI: Jesus Kennedy is a 57 y.o. male who is scheduled for, Procedure(s): KNEE ARTHROSCOPY WITH MEDIAL MENISECTOMY SYNOVECTOMY.   Patient has a past medical history significant for HTN.   The patient is a 57 year old who has had pain in the right knee since July 23, 2022.  He had immediate catching and locking in the medial aspect of his knee. He has had frustration with  the ability to participate in full activities.   Symptoms are rated as moderate to severe, and have been worsening.  This is significantly impairing activities of daily living.    Please see clinic note for further details on this patient's care.    He has elected for surgical management.   Past Medical History:  Diagnosis Date   ADHD (attention deficit hyperactivity disorder)    Arthritis    LEFT KNEE,R SHOULDER   Hypertension    Past Surgical History:  Procedure Laterality Date   APPENDECTOMY  2011   COLONOSCOPY     KNEE SURGERY     LEFT KNEE April 27 2017   Social History   Socioeconomic History   Marital status: Married    Spouse name: Diane   Number of children: 1   Years of education: Not on file   Highest education level: Not on file  Occupational History   Occupation: Firefighter    Comment: [employer]  Tobacco Use   Smoking status: Never   Smokeless tobacco: Never  Vaping Use   Vaping status: Never Used  Substance and Sexual Activity   Alcohol use: Yes    Comment: 3-4 X A WEEK   Drug use: No   Sexual activity: Yes    Partners: Female  Other Topics Concern   Not on file  Social History Narrative   No regular exercise but he does have a very physically active job.   Social Determinants of Health   Financial Resource Strain: Not on file  Food Insecurity: Not on file  Transportation Needs: Not on file  Physical Activity: Unknown (07/28/2018)   Exercise Vital Sign    Days of  Exercise per Week: 0 days    Minutes of Exercise per Session: Not on file  Stress: Not on file  Social Connections: Not on file   Family History  Problem Relation Age of Onset   Depression Mother    Hyperlipidemia Mother    Hypertension Mother        father   COPD Mother        smoker   Hypothyroidism Father    Hypothyroidism Brother    Heart defect Daughter        Tetralogy of Fallot    Colon cancer Neg Hx    Colon polyps Neg Hx    Esophageal cancer Neg Hx    Prostate cancer Neg Hx    Rectal cancer Neg Hx    Stomach cancer Neg Hx    Allergies  Allergen Reactions   Penicillins Rash   Hydrochlorothiazide Other (See Comments)   Prior to Admission medications   Medication Sig Start Date End Date Taking? Authorizing Provider  amLODipine (NORVASC) 10 MG tablet Take 1 tablet (10 mg total) by mouth daily. 12/30/21  Yes Agapito Games, MD  amphetamine-dextroamphetamine (ADDERALL) 10 MG tablet Take 1 tablet (10 mg total) by mouth 2 (two) times daily. 02/28/22  Yes Agapito Games, MD  amphetamine-dextroamphetamine (ADDERALL)  10 MG tablet Take 1 tablet (10 mg total) by mouth 2 (two) times daily. 05/28/22  Yes Agapito Games, MD  amphetamine-dextroamphetamine (ADDERALL) 10 MG tablet Take 1 tablet (10 mg total) by mouth 2 (two) times daily. 05/28/22  Yes Agapito Games, MD  celecoxib (CELEBREX) 100 MG capsule Take 1 capsule (100 mg total) by mouth 2 (two) times daily. 03/02/22  Yes Agapito Games, MD  traMADol (ULTRAM) 50 MG tablet Take 1 tablet (50 mg total) by mouth every 8 (eight) hours as needed for moderate pain. 07/24/22  Yes Monica Becton, MD  traZODone (DESYREL) 50 MG tablet Take 0.5-1 tablets (25-50 mg total) by mouth at bedtime as needed for sleep. 02/24/21  Yes Agapito Games, MD  valsartan (DIOVAN) 320 MG tablet Take 1 tablet (320 mg total) by mouth daily. 05/06/22  Yes Agapito Games, MD  AMBULATORY NON FORMULARY MEDICATION Take 2  tablets by mouth daily. Medication Name: focus factor    [provider]  B Complex-Biotin-FA (SUPER B-COMPLEX PO) Take by mouth.    [provider]  Coenzyme Q10 (CO Q 10 PO) Take by mouth.    [provider]  Misc Natural Products (OSTEO BI-FLEX JOINT SHIELD PO) Take by mouth.    [provider]  Multiple Vitamin (MULTIVITAMIN) tablet Take 1 tablet by mouth daily.    [provider]  Omega-3 Fatty Acids (FISH OIL PO) Take by mouth daily.    [provider]    ROS: All other systems have been reviewed and were otherwise negative with the exception of those mentioned in the HPI and as above.  Physical Exam: General: Alert, no acute distress Cardiovascular: No pedal edema Respiratory: No cyanosis, no use of accessory musculature GI: No organomegaly, abdomen is soft and non-tender Skin: No lesions in the area of chief complaint Neurologic: Sensation intact distally Psychiatric: Patient is competent for consent with normal mood and affect Lymphatic: No axillary or cervical lymphadenopathy  MUSCULOSKELETAL:  The range of motion of the knee is 0-90 degrees. He has pain with deep flexion. Tender to palpation over the medial joint line.   Imaging: MRI demonstrates a complex posterior medial  meniscus  tear and some mild degenerative changes  Assessment: right knee medial meniscus injury, synovitis  Plan: Plan for Procedure(s): KNEE ARTHROSCOPY WITH MEDIAL MENISECTOMY SYNOVECTOMY  The risks benefits and alternatives were discussed with the patient including but not limited to the risks of nonoperative treatment, versus surgical intervention including infection, bleeding, nerve injury,  blood clots, cardiopulmonary complications, morbidity, mortality, among others, and they were willing to proceed.   The patient acknowledged the explanation, agreed to proceed with the plan and consent was signed.   Operative Plan: Right knee  arthroscopy with medial meniscectomy Discharge Medications: standard DVT Prophylaxis: aspirin Physical Therapy: outpatient PT Special Discharge needs: +/-   Vernetta Honey, PA-C  08/10/2022 3:48 PM

## 2022-08-12 NOTE — Anesthesia Preprocedure Evaluation (Addendum)
Anesthesia Evaluation  Patient identified by MRN, date of birth, ID band Patient awake    Reviewed: Allergy & Precautions, NPO status , Patient's Chart, lab work & pertinent test results  Airway Mallampati: I  TM Distance: >3 FB Neck ROM: Full    Dental no notable dental hx. (+) Teeth Intact, Dental Advisory Given   Pulmonary neg pulmonary ROS   Pulmonary exam normal breath sounds clear to auscultation       Cardiovascular hypertension, Pt. on medications and Pt. on home beta blockers Normal cardiovascular exam Rhythm:Regular Rate:Normal     Neuro/Psych negative neurological ROS     GI/Hepatic negative GI ROS, Neg liver ROS,,,  Endo/Other    Renal/GU      Musculoskeletal  (+) Arthritis ,    Abdominal  (+) + obese (BMI 38)  Peds  Hematology   Anesthesia Other Findings   Reproductive/Obstetrics                             Anesthesia Physical Anesthesia Plan  ASA: 3  Anesthesia Plan: General   Post-op Pain Management: Precedex, Tylenol PO (pre-op)* and Toradol IV (intra-op)*   Induction: Intravenous  PONV Risk Score and Plan: 3 and Treatment may vary due to age or medical condition, Midazolam, Dexamethasone and Ondansetron  Airway Management Planned: LMA  Additional Equipment: None  Intra-op Plan:   Post-operative Plan: Extubation in OR  Informed Consent: I have reviewed the patients History and Physical, chart, labs and discussed the procedure including the risks, benefits and alternatives for the proposed anesthesia with the patient or authorized representative who has indicated his/her understanding and acceptance.     Dental advisory given  Plan Discussed with:   Anesthesia Plan Comments:         Anesthesia Quick Evaluation

## 2022-08-13 ENCOUNTER — Ambulatory Visit (HOSPITAL_BASED_OUTPATIENT_CLINIC_OR_DEPARTMENT_OTHER)
Admission: RE | Admit: 2022-08-13 | Discharge: 2022-08-13 | Disposition: A | Discharge status: Home / Self Care | Payer: Commercial Managed Care - PPO | Attending: Orthopaedic Surgery | Admitting: Orthopaedic Surgery

## 2022-08-13 ENCOUNTER — Other Ambulatory Visit: Payer: Self-pay

## 2022-08-13 ENCOUNTER — Encounter (HOSPITAL_BASED_OUTPATIENT_CLINIC_OR_DEPARTMENT_OTHER): Payer: Self-pay | Admitting: Orthopaedic Surgery

## 2022-08-13 ENCOUNTER — Ambulatory Visit (HOSPITAL_BASED_OUTPATIENT_CLINIC_OR_DEPARTMENT_OTHER): Payer: Commercial Managed Care - PPO | Admitting: Certified Registered"

## 2022-08-13 ENCOUNTER — Encounter (HOSPITAL_BASED_OUTPATIENT_CLINIC_OR_DEPARTMENT_OTHER)
Admission: RE | Disposition: A | Discharge status: Home / Self Care | Payer: Self-pay | Source: Home / Self Care | Attending: Orthopaedic Surgery

## 2022-08-13 DIAGNOSIS — Z79899 Other long term (current) drug therapy: Secondary | ICD-10-CM | POA: Insufficient documentation

## 2022-08-13 DIAGNOSIS — I10 Essential (primary) hypertension: Secondary | ICD-10-CM | POA: Insufficient documentation

## 2022-08-13 DIAGNOSIS — X58XXXA Exposure to other specified factors, initial encounter: Secondary | ICD-10-CM | POA: Diagnosis not present

## 2022-08-13 DIAGNOSIS — M1990 Unspecified osteoarthritis, unspecified site: Secondary | ICD-10-CM | POA: Insufficient documentation

## 2022-08-13 DIAGNOSIS — Z01818 Encounter for other preprocedural examination: Secondary | ICD-10-CM

## 2022-08-13 DIAGNOSIS — M659 Synovitis and tenosynovitis, unspecified: Secondary | ICD-10-CM | POA: Insufficient documentation

## 2022-08-13 DIAGNOSIS — E669 Obesity, unspecified: Secondary | ICD-10-CM | POA: Insufficient documentation

## 2022-08-13 DIAGNOSIS — I4510 Unspecified right bundle-branch block: Secondary | ICD-10-CM | POA: Diagnosis not present

## 2022-08-13 DIAGNOSIS — S83241A Other tear of medial meniscus, current injury, right knee, initial encounter: Secondary | ICD-10-CM | POA: Diagnosis present

## 2022-08-13 DIAGNOSIS — E785 Hyperlipidemia, unspecified: Secondary | ICD-10-CM

## 2022-08-13 DIAGNOSIS — F909 Attention-deficit hyperactivity disorder, unspecified type: Secondary | ICD-10-CM | POA: Diagnosis not present

## 2022-08-13 DIAGNOSIS — Z6838 Body mass index (BMI) 38.0-38.9, adult: Secondary | ICD-10-CM | POA: Diagnosis not present

## 2022-08-13 HISTORY — PX: KNEE ARTHROSCOPY WITH MEDIAL MENISECTOMY: SHX5651

## 2022-08-13 HISTORY — DX: Attention-deficit hyperactivity disorder, unspecified type: F90.9

## 2022-08-13 HISTORY — PX: SYNOVECTOMY: SHX5180

## 2022-08-13 SURGERY — ARTHROSCOPY, KNEE, WITH MEDIAL MENISCECTOMY
Anesthesia: General | Site: Knee | Laterality: Right

## 2022-08-13 MED ORDER — ONDANSETRON HCL 4 MG/2ML IJ SOLN
INTRAMUSCULAR | Status: DC | PRN
  Administered 2022-08-13: 4 mg via INTRAVENOUS

## 2022-08-13 MED ORDER — GABAPENTIN 300 MG PO CAPS
ORAL_CAPSULE | ORAL | Status: AC
  Filled 2022-08-13: qty 1

## 2022-08-13 MED ORDER — ACETAMINOPHEN 500 MG PO TABS
ORAL_TABLET | ORAL | Status: AC
  Filled 2022-08-13: qty 2

## 2022-08-13 MED ORDER — GABAPENTIN 300 MG PO CAPS
300.0000 mg | ORAL_CAPSULE | Freq: Once | ORAL | Status: AC
  Administered 2022-08-13: 300 mg via ORAL

## 2022-08-13 MED ORDER — CEFAZOLIN SODIUM-DEXTROSE 2-4 GM/100ML-% IV SOLN
2.0000 g | INTRAVENOUS | Status: AC
  Administered 2022-08-13: 2 g via INTRAVENOUS

## 2022-08-13 MED ORDER — PROPOFOL 10 MG/ML IV BOLUS
INTRAVENOUS | Status: DC | PRN
  Administered 2022-08-13: 200 mg via INTRAVENOUS

## 2022-08-13 MED ORDER — KETOROLAC TROMETHAMINE 30 MG/ML IJ SOLN: 30.0000 mg | Freq: Once | INTRAMUSCULAR | Status: DC | PRN

## 2022-08-13 MED ORDER — ACETAMINOPHEN 500 MG PO TABS
1000.0000 mg | ORAL_TABLET | Freq: Once | ORAL | Status: AC
  Administered 2022-08-13: 1000 mg via ORAL

## 2022-08-13 MED ORDER — DEXAMETHASONE SODIUM PHOSPHATE 10 MG/ML IJ SOLN
INTRAMUSCULAR | Status: AC
  Filled 2022-08-13: qty 1

## 2022-08-13 MED ORDER — PROPOFOL 10 MG/ML IV BOLUS
INTRAVENOUS | Status: AC
  Filled 2022-08-13: qty 20

## 2022-08-13 MED ORDER — ONDANSETRON HCL 4 MG/2ML IJ SOLN
INTRAMUSCULAR | Status: AC
  Filled 2022-08-13: qty 2

## 2022-08-13 MED ORDER — ONDANSETRON HCL 4 MG PO TABS: 4.0000 mg | ORAL_TABLET | Freq: Three times a day (TID) | ORAL | 0 refills | Status: AC | PRN

## 2022-08-13 MED ORDER — ASPIRIN 81 MG PO CHEW: 81.0000 mg | CHEWABLE_TABLET | Freq: Two times a day (BID) | ORAL | 0 refills | Status: AC

## 2022-08-13 MED ORDER — FENTANYL CITRATE (PF) 100 MCG/2ML IJ SOLN
INTRAMUSCULAR | Status: DC | PRN
  Administered 2022-08-13 (×4): 50 ug via INTRAVENOUS

## 2022-08-13 MED ORDER — ACETAMINOPHEN 500 MG PO TABS: 1000.0000 mg | ORAL_TABLET | Freq: Three times a day (TID) | ORAL | 0 refills | Status: AC

## 2022-08-13 MED ORDER — MIDAZOLAM HCL 5 MG/5ML IJ SOLN
INTRAMUSCULAR | Status: DC | PRN
  Administered 2022-08-13: 2 mg via INTRAVENOUS

## 2022-08-13 MED ORDER — BUPIVACAINE HCL (PF) 0.25 % IJ SOLN
INTRAMUSCULAR | Status: DC | PRN
  Administered 2022-08-13: 20 mL

## 2022-08-13 MED ORDER — LIDOCAINE HCL (CARDIAC) PF 100 MG/5ML IV SOSY
PREFILLED_SYRINGE | INTRAVENOUS | Status: DC | PRN
  Administered 2022-08-13: 100 mg via INTRAVENOUS

## 2022-08-13 MED ORDER — HYDROMORPHONE HCL 1 MG/ML IJ SOLN: 0.2500 mg | INTRAMUSCULAR | Status: DC | PRN

## 2022-08-13 MED ORDER — LIDOCAINE 2% (20 MG/ML) 5 ML SYRINGE
INTRAMUSCULAR | Status: AC
  Filled 2022-08-13: qty 5

## 2022-08-13 MED ORDER — FENTANYL CITRATE (PF) 100 MCG/2ML IJ SOLN
INTRAMUSCULAR | Status: AC
  Filled 2022-08-13: qty 2

## 2022-08-13 MED ORDER — ONDANSETRON HCL 4 MG/2ML IJ SOLN: 4.0000 mg | Freq: Once | INTRAMUSCULAR | Status: DC | PRN

## 2022-08-13 MED ORDER — OXYCODONE HCL 5 MG/5ML PO SOLN: 5.0000 mg | Freq: Once | ORAL | Status: DC | PRN

## 2022-08-13 MED ORDER — SODIUM CHLORIDE 0.9 % IR SOLN
Status: DC | PRN
  Administered 2022-08-13: 3000 mL

## 2022-08-13 MED ORDER — DEXAMETHASONE SODIUM PHOSPHATE 10 MG/ML IJ SOLN
INTRAMUSCULAR | Status: DC | PRN
  Administered 2022-08-13: 10 mg via INTRAVENOUS

## 2022-08-13 MED ORDER — CELECOXIB 100 MG PO CAPS: 100.0000 mg | ORAL_CAPSULE | Freq: Two times a day (BID) | ORAL | 0 refills | Status: AC

## 2022-08-13 MED ORDER — CEFAZOLIN SODIUM-DEXTROSE 2-4 GM/100ML-% IV SOLN
INTRAVENOUS | Status: AC
  Filled 2022-08-13: qty 100

## 2022-08-13 MED ORDER — LACTATED RINGERS IV SOLN: INTRAVENOUS | Status: DC

## 2022-08-13 MED ORDER — MIDAZOLAM HCL 2 MG/2ML IJ SOLN
INTRAMUSCULAR | Status: AC
  Filled 2022-08-13: qty 2

## 2022-08-13 MED ORDER — OXYCODONE HCL 5 MG PO TABS: ORAL_TABLET | ORAL | 0 refills | Status: AC

## 2022-08-13 MED ORDER — OXYCODONE HCL 5 MG PO TABS: 5.0000 mg | ORAL_TABLET | Freq: Once | ORAL | Status: DC | PRN

## 2022-08-13 SURGICAL SUPPLY — 33 items
APL PRP STRL LF DISP 70% ISPRP (MISCELLANEOUS) ×1
BANDAGE ESMARK 6X9 LF (GAUZE/BANDAGES/DRESSINGS) IMPLANT
BNDG CMPR 6 X 5 YARDS HK CLSR (GAUZE/BANDAGES/DRESSINGS) ×1
BNDG CMPR 9X6 STRL LF SNTH (GAUZE/BANDAGES/DRESSINGS)
BNDG ELASTIC 6INX 5YD STR LF (GAUZE/BANDAGES/DRESSINGS) ×1 IMPLANT
BNDG ESMARK 6X9 LF (GAUZE/BANDAGES/DRESSINGS)
CHLORAPREP W/TINT 26 (MISCELLANEOUS) ×1 IMPLANT
CLSR STERI-STRIP ANTIMIC 1/2X4 (GAUZE/BANDAGES/DRESSINGS) ×1 IMPLANT
CUFF TOURN SGL QUICK 34 (TOURNIQUET CUFF) ×1
CUFF TRNQT CYL 34X4.125X (TOURNIQUET CUFF) ×1 IMPLANT
DISSECTOR 4.0MMX13CM CVD (MISCELLANEOUS) ×1 IMPLANT
DRAPE ARTHROSCOPY W/POUCH 90 (DRAPES) ×1 IMPLANT
DRAPE IMP U-DRAPE 54X76 (DRAPES) ×1 IMPLANT
DRAPE U-SHAPE 47X51 STRL (DRAPES) ×1 IMPLANT
GAUZE SPONGE 4X4 12PLY STRL (GAUZE/BANDAGES/DRESSINGS) ×1 IMPLANT
GLOVE BIO SURGEON STRL SZ 6.5 (GLOVE) ×1 IMPLANT
GLOVE BIOGEL PI IND STRL 6.5 (GLOVE) ×1 IMPLANT
GLOVE BIOGEL PI IND STRL 8 (GLOVE) ×1 IMPLANT
GLOVE ECLIPSE 8.0 STRL XLNG CF (GLOVE) ×2 IMPLANT
GOWN STRL REUS W/ TWL LRG LVL3 (GOWN DISPOSABLE) ×1 IMPLANT
GOWN STRL REUS W/TWL LRG LVL3 (GOWN DISPOSABLE) ×1
GOWN STRL REUS W/TWL XL LVL3 (GOWN DISPOSABLE) ×1 IMPLANT
KIT TURNOVER KIT B (KITS) ×1 IMPLANT
MANIFOLD NEPTUNE II (INSTRUMENTS) IMPLANT
NS IRRIG 1000ML POUR BTL (IV SOLUTION) IMPLANT
PACK ARTHROSCOPY DSU (CUSTOM PROCEDURE TRAY) ×1 IMPLANT
PORT APPOLLO RF 90DEGREE MULTI (SURGICAL WAND) IMPLANT
SLEEVE SCD COMPRESS KNEE MED (STOCKING) ×1 IMPLANT
SUT MNCRL AB 4-0 PS2 18 (SUTURE) ×1 IMPLANT
TOWEL GREEN STERILE FF (TOWEL DISPOSABLE) ×1 IMPLANT
TUBE CONNECTING 20X1/4 (TUBING) ×1 IMPLANT
TUBING ARTHROSCOPY IRRIG 16FT (MISCELLANEOUS) ×1 IMPLANT
WATER STERILE IRR 1000ML POUR (IV SOLUTION) ×1 IMPLANT

## 2022-08-13 NOTE — Transfer of Care (Signed)
Immediate Anesthesia Transfer of Care Note  Patient: AKI ABALOS  Procedure(s) Performed: KNEE ARTHROSCOPY WITH MEDIAL MENISECTOMY (Right: Knee) SYNOVECTOMY (Right: Knee)  Patient Location: PACU  Anesthesia Type:General  Level of Consciousness: awake, alert , and oriented  Airway & Oxygen Therapy: Patient Spontanous Breathing and Patient connected to nasal cannula oxygen  Post-op Assessment: Report given to RN and Post -op Vital signs reviewed and stable  Post vital signs: Reviewed and stable  Last Vitals:  Vitals Value Taken Time  BP 135/79 08/13/22 1330  Temp    Pulse 68 08/13/22 1332  Resp 10 08/13/22 1332  SpO2 98 % 08/13/22 1332  Vitals shown include unfiled device data.  Last Pain:  Vitals:   08/13/22 0909  TempSrc: Oral  PainSc: 1          Complications: No notable events documented.

## 2022-08-13 NOTE — Anesthesia Procedure Notes (Signed)
Procedure Name: LMA Insertion Date/Time: 08/13/2022 12:56 PM  Performed by: Lauralyn Primes, CRNAPre-anesthesia Checklist: Patient identified, Emergency Drugs available, Suction available and Patient being monitored Patient Re-evaluated:Patient Re-evaluated prior to induction Oxygen Delivery Method: Circle system utilized Preoxygenation: Pre-oxygenation with 100% oxygen Induction Type: IV induction Ventilation: Mask ventilation without difficulty LMA: LMA inserted LMA Size: 5.0 Number of attempts: 1 Airway Equipment and Method: Bite block Placement Confirmation: positive ETCO2 Tube secured with: Tape Dental Injury: Teeth and Oropharynx as per pre-operative assessment

## 2022-08-13 NOTE — Anesthesia Postprocedure Evaluation (Signed)
Anesthesia Post Note  Patient: CARTHEL CASTILLE  Procedure(s) Performed: KNEE ARTHROSCOPY WITH MEDIAL MENISECTOMY (Right: Knee) SYNOVECTOMY (Right: Knee)     Patient location during evaluation: PACU Anesthesia Type: General Level of consciousness: awake and alert Pain management: pain level controlled Vital Signs Assessment: post-procedure vital signs reviewed and stable Respiratory status: spontaneous breathing, nonlabored ventilation, respiratory function stable and patient connected to nasal cannula oxygen Cardiovascular status: blood pressure returned to baseline and stable Postop Assessment: no apparent nausea or vomiting Anesthetic complications: no   No notable events documented.  Last Vitals:  Vitals:   08/13/22 1353 08/13/22 1407  BP: (!) 154/83 (!) 154/100  Pulse: 63 71  Resp: 10 16  Temp:  (!) 36.2 C  SpO2: 96% 99%    Last Pain:  Vitals:   08/13/22 1407  TempSrc:   PainSc: 0-No pain                 Trevor Iha

## 2022-08-13 NOTE — Op Note (Signed)
Orthopaedic Surgery Operative Note (CSN: [id number])  Jesus Kennedy  08/13/1965 Date of Surgery: 08/13/2022   Diagnoses:  right knee medial meniscus injury, synovitis  Procedure: Right partial medial meniscectomy and synovectomy 2 compartment   Operative Finding Full motion no limitation no instability, medial compartment had grade 3 cartilage loss in the posterior medial aspect of the medial femoral condyle and the medial tibial plateau.  This was organized in relationship to a radial tear that was completed through the medial meniscus.  There were no residual radial fibers.  We debrided about 30% total medial meniscal volume.  Patellofemoral compartment had grade 3 and 4 central trochlear cartilage loss and grade 2 changes throughout the inferior pole of the patella.  Lateral compartment was essentially normal.  Successful completion of the planned procedure.    Post-operative plan: The patient will be weightbearing to tolerance.  The patient will be discharged home.  DVT prophylaxis Aspirin 81 mg twice daily for 6 weeks.  Pain control with PRN pain medication preferring oral medicines.  Follow up plan will be scheduled in approximately 7 days for incision check and XR.  Post-Op Diagnosis: Same Surgeons:Primary: [name], MD Assistants:[name] PA-C Location: [facility] OR ROOM 6 Anesthesia: General with local Antibiotics: Ancef 2 g Tourniquet time:  Estimated Blood Loss: Minimal Complications: None Specimens: None Implants: * No implants in log *  Indications for Surgery:   Jesus Kennedy is a 57 y.o. male with medial meniscus tear and mechanical symptoms.  Benefits and risks of operative and nonoperative management were discussed prior to surgery with patient/guardian(s) and informed consent form was completed.  Specific risks including infection, need for additional surgery, continued pain, post meniscectomy syndrome and continued pain from arthrosis amongst  others.   Procedure:   The patient was identified properly. Informed consent was obtained and the surgical site was marked. The patient was taken up to suite where general anesthesia was induced. The patient was placed in the supine position with a post against the surgical leg and a nonsterile tourniquet applied. The surgical leg was then prepped and draped usual sterile fashion.  A standard surgical timeout was performed.  2 standard anterior portals were made and diagnostic arthroscopy performed. Please note the findings as noted above.  We used a shaver and a basket debride back medial meniscus to a stable base.  The knee was cleared.  Synovectomy performed of the medial lateral compartments.  Loose tissue was removed.  Incisions closed with absorbable suture. The patient was awoken from general anesthesia and taken to the PACU in stable condition without complication.   [name], PA-C, present and scrubbed throughout the case, critical for completion in a timely fashion, and for retraction, instrumentation, closure.

## 2022-08-13 NOTE — Interval H&P Note (Signed)
All questions answered, patient wants to proceed with procedure. ? ?

## 2022-08-13 NOTE — Discharge Instructions (Addendum)
Jesus Marrow MD, MPH [name], PA-C [facility] [address] [phone] (tel)   [phone] (fax)   POST-OPERATIVE INSTRUCTIONS - Knee Arthroscopy  WOUND CARE - You may remove the Operative Dressing on Post-Op Day #3 (72hrs after surgery).   -  Alternatively if you would like you can leave dressing on until follow-up if within 7-8 days but keep it dry. - Leave steri-strips in place until they fall off on their own, usually 2 weeks postop. - An ACE wrap may be used to control swelling, do not wrap this too tight.  If the initial ACE wrap feels too tight you may loosen it. - There may be a small amount of fluid/bleeding leaking at the surgical site.  - This is normal; the knee is filled with fluid during the procedure and can leak for 24-48hrs after surgery. You may change/reinforce the bandage as needed.  - Use the Cryocuff or Ice as often as possible for the first 7 days, then as needed for pain relief. Always keep a towel, ACE wrap or other barrier between the cooling unit and your skin.  - You may shower on Post-Op Day #3. Gently pat the area dry.  - Do not soak the knee in water or submerge it.  - Do not go swimming in the pool or ocean until 4 weeks after surgery or when otherwise instructed.  Keep dry incisions as dry as possible.   BRACE/AMBULATION -            You will not need a brace after this procedure.   - You may use crutches initially to help you weight bear, but this is not required - You can put full weight on your operative leg as you feel comfortable  PHYSICAL THERAPY - You will begin physical therapy soon after surgery (unless otherwise specified) - Please call to set up an appointment, if you do not already have one  - Let our office if there are any issues with scheduling your therapy  - You have a physical therapy appointment scheduled at [facility] (across the hall from our office) on July 29th    REGIONAL ANESTHESIA  (NERVE BLOCKS) The anesthesia team may have performed a nerve block for you this is a great tool used to minimize pain.   The block may start wearing off overnight (between 8-24 hours postop) When the block wears off, your pain may go from nearly zero to the pain you would have had postop without the block. This is an abrupt transition but nothing dangerous is happening.   This can be a challenging period but utilize your as needed pain medications to try and manage this period. We suggest you use the pain medication the first night prior to going to bed, to ease this transition.  You may take an extra dose of narcotic when this happens if needed   POST-OP MEDICATIONS- Multimodal approach to pain control In general your pain will be controlled with a combination of substances.  Prescriptions unless otherwise discussed are electronically sent to your pharmacy.  This is a carefully made plan we use to minimize narcotic use.     Celebrex - Anti-inflammatory medication taken on a scheduled basis Acetaminophen - Non-narcotic pain medicine taken on a scheduled basis  Oxycodone - This is a strong narcotic, to be used only on an "as needed" basis for SEVERE pain. Aspirin 81mg  - This medicine is used to minimize the risk of blood clots  after surgery. Zofran - take as needed for nausea   FOLLOW-UP   Please call the office to schedule a follow-up appointment for your incision check, 7-10 days post-operatively.   IF YOU HAVE ANY QUESTIONS, PLEASE FEEL FREE TO CALL OUR OFFICE.   HELPFUL INFORMATION   Keep your leg elevated to decrease swelling, which will then in turn decrease your pain. I would elevate the foot of your bed by putting a couple of couch pillows between your mattress and box spring. I would not keep pillow directly under your ankle.  - Do not sleep with a pillow behind your knee even if it is more comfortable as this may make it harder to get your knee fully straight long  term.   There will be MORE swelling on days 1-3 than there is on the day of surgery.  This also is normal. The swelling will decrease with the anti-inflammatory medication, ice and keeping it elevated. The swelling will make it more difficult to bend your knee. As the swelling goes down your motion will become easier   You may develop swelling and bruising that extends from your knee down to your calf and perhaps even to your foot over the next week. Do not be alarmed. This too is normal, and it is due to gravity   There may be some numbness adjacent to the incision site. This may last for 6-12 months or longer in some patients and is expected.   You may return to sedentary work/school in the next couple of days when you feel up to it. You will need to keep your leg elevated as much as possible    You should wean off your narcotic medicines as soon as you are able.   Most patients are off narcotics before their first postop appointment - by post op day #3.    We suggest you use the pain medication the first night prior to going to bed, in order to ease any pain when the anesthesia wears off. You should avoid taking pain medications on an empty stomach as it will make you nauseous.   Do not drink alcoholic beverages or take illicit drugs when taking pain medications.   It is against the law to drive while taking narcotics. You cannot drive if your Right leg is in brace locked in extension.   Pain medication may make you constipated.  Below are a few solutions to try in this order:  o Decrease the amount of pain medication if you aren't having pain.  o Drink lots of decaffeinated fluids.  o Drink prune juice and/or eat dried prunes   o If the first 3 don't work start with additional solutions  o Take Colace - an over-the-counter stool softener  o Take Senokot - an over-the-counter laxative  o Take Miralax - a stronger over-the-counter laxative    For more information including  helpful videos and documents visit our website:   [email or web address]      Post Anesthesia Home Care Instructions  Activity: Get plenty of rest for the remainder of the day. A responsible individual must stay with you for 24 hours following the procedure.  For the next 24 hours, DO NOT: -Drive a car -Advertising copywriter -Drink alcoholic beverages -Take any medication unless instructed by your physician -Make any legal decisions or sign important papers.  Meals: Start with liquid foods such as gelatin or soup. Progress to regular foods as tolerated. Avoid greasy, spicy, heavy foods. If nausea and/or vomiting  occur, drink only clear liquids until the nausea and/or vomiting subsides. Call your physician if vomiting continues.  Special Instructions/Symptoms: Your throat may feel dry or sore from the anesthesia or the breathing tube placed in your throat during surgery. If this causes discomfort, gargle with warm salt water. The discomfort should disappear within 24 hours.  If you had a scopolamine patch placed behind your ear for the management of post- operative nausea and/or vomiting:  1. The medication in the patch is effective for 72 hours, after which it should be removed.  Wrap patch in a tissue and discard in the trash. Wash hands thoroughly with soap and water. 2. You may remove the patch earlier than 72 hours if you experience unpleasant side effects which may include dry mouth, dizziness or visual disturbances. 3. Avoid touching the patch. Wash your hands with soap and water after contact with the patch.

## 2022-08-14 ENCOUNTER — Encounter (HOSPITAL_BASED_OUTPATIENT_CLINIC_OR_DEPARTMENT_OTHER): Payer: Self-pay | Admitting: Orthopaedic Surgery

## 2022-09-11 ENCOUNTER — Other Ambulatory Visit: Payer: Self-pay | Admitting: Family Medicine

## 2022-09-11 DIAGNOSIS — F5102 Adjustment insomnia: Secondary | ICD-10-CM

## 2022-09-11 MED ORDER — VALSARTAN 320 MG PO TABS: 320.0000 mg | ORAL_TABLET | Freq: Every day | ORAL | 0 refills | Status: DC

## 2022-09-11 NOTE — Telephone Encounter (Signed)
Requesting rx rf of Valsartan 320mg   Last written 05/06/2022 #90 0 refills Last OV 12/30/2021 No upcoming appt schld.

## 2022-09-11 NOTE — Telephone Encounter (Signed)
Prescription sent please have him schedule a follow-up in September.

## 2022-09-14 MED ORDER — TRAZODONE HCL 50 MG PO TABS
25.0000 mg | ORAL_TABLET | Freq: Every evening | ORAL | 2 refills | Status: DC | PRN
Start: 2022-09-14 — End: 2023-08-12

## 2022-09-14 NOTE — Telephone Encounter (Signed)
Patient requesting rx rf of Trazodone 50mg   L;ast written 03/06/2021 Last OV 12/30/2021 Upcoming appt 09/28/2022

## 2022-09-14 NOTE — Telephone Encounter (Signed)
Patient scheduled for 09/28/22, and patient would like refill on his traZODone (DESYREL) 50 MG tablet, please advise, thanks.

## 2022-09-28 ENCOUNTER — Encounter: Payer: Self-pay | Admitting: Family Medicine

## 2022-09-28 ENCOUNTER — Ambulatory Visit (INDEPENDENT_AMBULATORY_CARE_PROVIDER_SITE_OTHER): Payer: Commercial Managed Care - PPO | Admitting: Family Medicine

## 2022-09-28 VITALS — BP 134/70 | HR 69 | Ht 68.0 in | Wt 257.0 lb

## 2022-09-28 DIAGNOSIS — E785 Hyperlipidemia, unspecified: Secondary | ICD-10-CM

## 2022-09-28 DIAGNOSIS — Z125 Encounter for screening for malignant neoplasm of prostate: Secondary | ICD-10-CM

## 2022-09-28 DIAGNOSIS — K760 Fatty (change of) liver, not elsewhere classified: Secondary | ICD-10-CM

## 2022-09-28 DIAGNOSIS — I10 Essential (primary) hypertension: Secondary | ICD-10-CM | POA: Diagnosis not present

## 2022-09-28 DIAGNOSIS — F988 Other specified behavioral and emotional disorders with onset usually occurring in childhood and adolescence: Secondary | ICD-10-CM

## 2022-09-28 NOTE — Assessment & Plan Note (Signed)
To recheck lipid function.

## 2022-09-28 NOTE — Progress Notes (Signed)
   Established Patient Office Visit  Subjective   Patient ID: Jesus Kennedy, male    DOB: Mar 22, 1965  Age: 57 y.o. MRN: [id number]  Chief Complaint  Patient presents with   Hypertension    HPI Hypertension- Pt denies chest pain, SOB, dizziness, or heart palpitations.  Taking meds as directed w/o problems.  Denies medication side effects.    He recently underwent knee arthroscopy with Dr. Ramond Marrow on August 13, 2022. He is still having some pain.  Been doing mostly home PT.  He is still having a fair amount of pain especially with extended walking.  He is just icing aggressively and using his Tylenol as needed.  ADD - Reports symptoms are well controlled on current regime. Denies any problems with insomnia, chest pain, palpitations, or SOB.    He quit his job.  His stress levels have really come down. 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     ROS    Objective:     BP 134/70   Pulse 69   Ht 5\' 8"  (1.727 m)   Wt 257 lb (116.6 kg)   SpO2 93%   BMI 39.08 kg/m    Physical Exam Vitals and nursing note reviewed.  Constitutional:      Appearance: Normal appearance.  HENT:     Head: Normocephalic and atraumatic.  Eyes:     Conjunctiva/sclera: Conjunctivae normal.  Cardiovascular:     Rate and Rhythm: Normal rate and regular rhythm.  Pulmonary:     Effort: Pulmonary effort is normal.     Breath sounds: Normal breath sounds.  Skin:    General: Skin is warm and dry.  Neurological:     Mental Status: He is alert.  Psychiatric:        Mood and Affect: Mood normal.      No results found for any visits on 09/28/22.    The 10-year ASCVD risk score (Arnett DK, et al., 2019) is: 6.4%    Assessment & Plan:   Problem List Items Addressed This Visit       Cardiovascular and Mediastinum   Essential hypertension - Primary    Well controlled. Continue current regimen. Follow up in  43mo       Relevant Orders   CMP14+EGFR   Lipid panel   PSA      Digestive   Fatty liver    Due to recheck liver function I suspect is probably still mildly elevated especially since he is been fairly sedentary since about mid June with his knee.      Relevant Orders   CMP14+EGFR   Lipid panel   PSA   US ABDOMEN COMPLETE W/ELASTOGRAPHY     Other   Hyperlipidemia    To recheck lipid function.      Relevant Orders   CMP14+EGFR   Lipid panel   PSA   ADD (attention deficit disorder)    Continue current regimen he really just takes his Adderall as needed.      Other Visit Diagnoses     Screening for malignant neoplasm of prostate       Relevant Orders   PSA      Declined Tdap today   Return in about 6 months (around 03/28/2023) for Hypertension.    Nani Gasser, MD

## 2022-09-28 NOTE — Assessment & Plan Note (Signed)
Well controlled. Continue current regimen. Follow up in  6 mo  

## 2022-09-28 NOTE — Assessment & Plan Note (Signed)
Continue current regimen he really just takes his Adderall as needed.

## 2022-09-28 NOTE — Assessment & Plan Note (Signed)
Due to recheck liver function I suspect is probably still mildly elevated especially since he is been fairly sedentary since about mid June with his knee.

## 2023-01-18 ENCOUNTER — Other Ambulatory Visit: Payer: Self-pay | Admitting: Family Medicine

## 2023-01-19 MED ORDER — VALSARTAN 320 MG PO TABS: 320.0000 mg | ORAL_TABLET | Freq: Every day | ORAL | 0 refills | Status: DC

## 2023-03-21 ENCOUNTER — Other Ambulatory Visit: Payer: Self-pay | Admitting: Family Medicine

## 2023-03-21 DIAGNOSIS — F988 Other specified behavioral and emotional disorders with onset usually occurring in childhood and adolescence: Secondary | ICD-10-CM

## 2023-03-23 NOTE — Telephone Encounter (Signed)
 Requesting rx for Adderall 10mg   Last written =05/28/2022 Last OV 09/28/2022 No upcoming appt schld.  Attempted call to patient to inquire as to long timeframe since last refill.  Phone rang without answer. Could not leave a voice mail message.

## 2023-03-24 MED ORDER — AMPHETAMINE-DEXTROAMPHETAMINE 10 MG PO TABS
10.0000 mg | ORAL_TABLET | Freq: Two times a day (BID) | ORAL | 0 refills | Status: AC
Start: 2023-03-24 — End: ?

## 2023-03-29 ENCOUNTER — Ambulatory Visit: Payer: Commercial Managed Care - PPO | Admitting: Family Medicine

## 2023-06-29 ENCOUNTER — Other Ambulatory Visit: Payer: Self-pay | Admitting: Family Medicine

## 2023-06-30 NOTE — Telephone Encounter (Signed)
 Pls contact pt to schedule with Dr. Greer Leak for HTN follow-up. Past due: Return in about 6 months (around 03/28/2023) for Hypertension.

## 2023-06-30 NOTE — Telephone Encounter (Signed)
 Patient scheduled for 08/12/23, but would like to know if he could get   his medication refills for his   valsartan  (DIOVAN ) 320 MG tablet

## 2023-08-12 ENCOUNTER — Encounter: Payer: Self-pay | Admitting: Family Medicine

## 2023-08-12 ENCOUNTER — Ambulatory Visit (INDEPENDENT_AMBULATORY_CARE_PROVIDER_SITE_OTHER): Admitting: Family Medicine

## 2023-08-12 VITALS — BP 144/79 | HR 61 | Ht 68.0 in | Wt 261.6 lb

## 2023-08-12 DIAGNOSIS — Z23 Encounter for immunization: Secondary | ICD-10-CM

## 2023-08-12 DIAGNOSIS — F5102 Adjustment insomnia: Secondary | ICD-10-CM

## 2023-08-12 DIAGNOSIS — I10 Essential (primary) hypertension: Secondary | ICD-10-CM

## 2023-08-12 DIAGNOSIS — E785 Hyperlipidemia, unspecified: Secondary | ICD-10-CM

## 2023-08-12 MED ORDER — VALSARTAN 320 MG PO TABS: 320.0000 mg | ORAL_TABLET | Freq: Every day | ORAL | 1 refills | Status: AC

## 2023-08-12 MED ORDER — AMLODIPINE BESYLATE 10 MG PO TABS: 10.0000 mg | ORAL_TABLET | Freq: Every day | ORAL | 3 refills | Status: AC

## 2023-08-12 MED ORDER — TRAZODONE HCL 50 MG PO TABS: 25.0000 mg | ORAL_TABLET | Freq: Every evening | ORAL | 1 refills | Status: AC | PRN

## 2023-08-12 NOTE — Progress Notes (Signed)
 Established Patient Office Visit  Subjective  Patient ID: Jesus Kennedy, male    DOB: Jan 29, 1965  Age: 58 y.o. MRN: [id number]  Chief Complaint  Patient presents with   Hypertension    HPI  Hypertension- Pt denies chest pain, SOB, dizziness, or heart palpitations.  Taking meds as directed w/o problems.  Denies medication side effects.  Does have a blood pressure cuff at home but has not checked his pressures recently.  Blood pressures at home mostly in the 130s.  Scheduled for right knee arthroscopy tomorrow.  He has been try to get more active recently started going to the gym.  His goal is to lose about 25 pounds and also wants to work to get his blood pressure under better control.  He lost his job last May and so has been on the hunt to find a new job he has been on interviews but has not been able to find anything consistent yet.  He is hopeful that he will be to find something soon.  He says he really wants to get on track he is actually started going to the gym he plans to lose about 25 pounds.      ROS    Objective:     BP (!) 144/79   Pulse 61   Ht 5' 8 (1.727 m)   Wt 261 lb 9.6 oz (118.7 kg)   SpO2 94%   BMI 39.78 kg/m    Physical Exam Vitals and nursing note reviewed.  Constitutional:      Appearance: Normal appearance.  HENT:     Head: Normocephalic and atraumatic.  Eyes:     Conjunctiva/sclera: Conjunctivae normal.  Cardiovascular:     Rate and Rhythm: Normal rate and regular rhythm.  Pulmonary:     Effort: Pulmonary effort is normal.     Breath sounds: Normal breath sounds.  Skin:    General: Skin is warm and dry.  Neurological:     Mental Status: He is alert.  Psychiatric:        Mood and Affect: Mood normal.      No results found for any visits on 08/12/23.    The 10-year ASCVD risk score (Arnett DK, et al., 2019) is: 8%    Assessment & Plan:   Problem List Items Addressed This Visit       Cardiovascular and  Mediastinum   Essential hypertension - Primary   Repeat blood pressure still not well-controlled he is getting a little bit better numbers mostly in the 130s at home did encourage him to check it again over the next couple of weeks.  He is currently on valsartan  320 mg and amlodipine  10 mg we did refill those today.  He does not tolerate hydrochlorothiazide.  So we could look at adding a low-dose of metoprolol or trying a different diuretic such as spironolactone.      Relevant Medications   amLODipine  (NORVASC ) 10 MG tablet   valsartan  (DIOVAN ) 320 MG tablet   Other Relevant Orders   CMP14+EGFR   Lipid Panel With LDL/HDL Ratio   HgB A1c     Other   Hyperlipidemia   Due for updated lipid panel today.  Not currently on a statin.  He does take fish oil.      Relevant Medications   amLODipine  (NORVASC ) 10 MG tablet   valsartan  (DIOVAN ) 320 MG tablet   Other Relevant Orders   CMP14+EGFR   Lipid Panel With LDL/HDL Ratio   HgB A1c  Adjustment insomnia   Currently on trazodone  for sleep.  Tolerating well.      Relevant Medications   traZODone  (DESYREL ) 50 MG tablet   Other Visit Diagnoses       Essential hypertension, benign       Relevant Medications   amLODipine  (NORVASC ) 10 MG tablet   valsartan  (DIOVAN ) 320 MG tablet     Encounter for immunization       Relevant Orders   Tdap vaccine greater than or equal to 7yo IM (Completed)      Tdap vaccine updated today.  Return in about 6 weeks (around 09/23/2023) for Nurse BP Check.    Dorothyann Byars, MD

## 2023-08-12 NOTE — Assessment & Plan Note (Signed)
 Due for updated lipid panel today.  Not currently on a statin.  He does take fish oil.

## 2023-08-12 NOTE — Assessment & Plan Note (Signed)
 Repeat blood pressure still not well-controlled he is getting a little bit better numbers mostly in the 130s at home did encourage him to check it again over the next couple of weeks.  He is currently on valsartan  320 mg and amlodipine  10 mg we did refill those today.  He does not tolerate hydrochlorothiazide.  So we could look at adding a low-dose of metoprolol or trying a different diuretic such as spironolactone.

## 2023-08-12 NOTE — Assessment & Plan Note (Signed)
 Currently on trazodone  for sleep.  Tolerating well.

## 2023-08-13 ENCOUNTER — Ambulatory Visit: Payer: Self-pay | Admitting: Family Medicine

## 2023-08-13 LAB — CMP14+EGFR
ALT: 44 IU/L (ref 0–44)
AST: 31 IU/L (ref 0–40)
Albumin: 4.4 g/dL (ref 3.8–4.9)
Alkaline Phosphatase: 62 IU/L (ref 44–121)
BUN/Creatinine Ratio: 14 (ref 9–20)
BUN: 13 mg/dL (ref 6–24)
Bilirubin Total: 0.7 mg/dL (ref 0.0–1.2)
CO2: 19 mmol/L — ABNORMAL LOW (ref 20–29)
Calcium: 9.7 mg/dL (ref 8.7–10.2)
Chloride: 105 mmol/L (ref 96–106)
Creatinine, Ser: 0.96 mg/dL (ref 0.76–1.27)
Globulin, Total: 2.3 g/dL (ref 1.5–4.5)
Glucose: 125 mg/dL — ABNORMAL HIGH (ref 70–99)
Potassium: 4.1 mmol/L (ref 3.5–5.2)
Sodium: 141 mmol/L (ref 134–144)
Total Protein: 6.7 g/dL (ref 6.0–8.5)
eGFR: 93 mL/min/1.73 (ref >59)

## 2023-08-13 LAB — LIPID PANEL WITH LDL/HDL RATIO
Cholesterol, Total: 222 mg/dL — ABNORMAL HIGH (ref 100–199)
HDL: 41 mg/dL (ref >39)
LDL Chol Calc (NIH): 146 mg/dL — ABNORMAL HIGH (ref 0–99)
LDL/HDL Ratio: 3.6 ratio (ref 0.0–3.6)
Triglycerides: 192 mg/dL — ABNORMAL HIGH (ref 0–149)
VLDL Cholesterol Cal: 35 mg/dL (ref 5–40)

## 2023-08-13 LAB — HEMOGLOBIN A1C
Est. average glucose Bld gHb Est-mCnc: 94 mg/dL
Hgb A1c MFr Bld: 4.9 % (ref 4.8–5.6)

## 2023-08-13 NOTE — Progress Notes (Signed)
 Hi Jesus Kennedy, the ALT liver enzyme is actually back to normal which is great.  It has been up for several years and it is finally back in the normal range!  Triglycerides and LDL are still high I know you said you are planning on working on exercise and weight loss so that should definitely make a difference.  Currently your 10-year cardiovascular risk is around 10% which is very high.  At this percentage we would highly recommend you taking a statin to reduce your risk for heart attack and stroke.  Let me know if that something you would be okay with or if you want to recheck the lipids maybe in 6 months after you have made some changes and you are working on it and we can reassess the risk calculation.  No sign of diabetes or prediabetes at this point.  Also I thought some more about your blood pressure yesterday I know you said at home you were still getting some pretty consistently elevated pressures over the last year or 2.  Are you okay with me adding a different medication to your regimen?  The 10-year ASCVD risk score (Arnett DK, et al., 2019) is: 10.7%   Values used to calculate the score:     Age: 58 years     Clincally relevant sex: Male     Is Non-Hispanic African American: No     Diabetic: No     Tobacco smoker: No     Systolic Blood Pressure: 144 mmHg     Is BP treated: Yes     HDL Cholesterol: 41 mg/dL     Total Cholesterol: 222 mg/dL

## 2023-08-16 NOTE — Telephone Encounter (Signed)
 Routing to Dr. Alvan as an RICK that pt wants to hold off on statin therapy for now.

## 2023-09-21 ENCOUNTER — Encounter: Payer: Self-pay | Admitting: Sports Medicine

## 2023-09-23 ENCOUNTER — Ambulatory Visit

## 2023-12-28 ENCOUNTER — Ambulatory Visit: Admission: EM | Admit: 2023-12-28 | Discharge: 2023-12-28 | Disposition: A | Discharge status: Home / Self Care

## 2023-12-28 ENCOUNTER — Emergency Department (HOSPITAL_BASED_OUTPATIENT_CLINIC_OR_DEPARTMENT_OTHER)
Admission: EM | Admit: 2023-12-28 | Discharge: 2023-12-28 | Disposition: A | Discharge status: Home / Self Care | Attending: Emergency Medicine | Admitting: Emergency Medicine

## 2023-12-28 ENCOUNTER — Encounter (HOSPITAL_BASED_OUTPATIENT_CLINIC_OR_DEPARTMENT_OTHER): Payer: Self-pay

## 2023-12-28 ENCOUNTER — Other Ambulatory Visit: Payer: Self-pay

## 2023-12-28 DIAGNOSIS — S00401A Unspecified superficial injury of right ear, initial encounter: Secondary | ICD-10-CM

## 2023-12-28 DIAGNOSIS — D1801 Hemangioma of skin and subcutaneous tissue: Secondary | ICD-10-CM

## 2023-12-28 DIAGNOSIS — S00411A Abrasion of right ear, initial encounter: Secondary | ICD-10-CM

## 2023-12-28 MED ORDER — SILVER NITRATE-POT NITRATE 75-25 % EX MISC
1.0000 | Freq: Once | CUTANEOUS | Status: AC
  Administered 2023-12-28: 1 via TOPICAL
  Filled 2023-12-28: qty 10

## 2023-12-28 NOTE — Discharge Instructions (Signed)
 You were seen here today for your ear bleeding.  The bleeding has resolved.  Please refrain from putting anything in the ear such as Q-tips, earbuds, to prevent further irritation of the area.  It appears that the area that is bleeding is a skin lesion in the ear.  Please have your PCP check this area once it has stopped bleeding to ensure it does not need to be biopsied.  Please return to the emergency room for any other new or concerning symptoms.

## 2023-12-28 NOTE — ED Provider Notes (Signed)
 Jesus Kennedy    CSN: [id number] Arrival date & time: 12/28/23  0806      History   Chief Complaint Chief Complaint  Patient presents with   Ear Injury    HPI Jesus Kennedy is a 58 y.o. male.   HPI 58 year old male presents with right ear bleeding.  Reports having small red spot/bump that began bleeding then stopped after putting an earbud and yesterday it began to bleed again.  PMH significant for obesity, adjustment insomnia, ADD, and HTN.  Past Medical History:  Diagnosis Date   ADHD (attention deficit hyperactivity disorder)    Arthritis    LEFT KNEE,R SHOULDER   Hypertension     Patient Active Problem List   Diagnosis Date Noted   Right knee meniscal tear 07/24/2022   Fatty liver 12/30/2021   Adjustment insomnia 02/24/2021   BMI 37.0-37.9, adult 02/23/2020   Hyperlipidemia 05/18/2011   ADD (attention deficit disorder) 05/18/2011   Essential hypertension 04/30/2011   Arthralgia 04/30/2011    Past Surgical History:  Procedure Laterality Date   APPENDECTOMY  2011   COLONOSCOPY     KNEE ARTHROSCOPY WITH MEDIAL MENISECTOMY Right 08/13/2022   Procedure: KNEE ARTHROSCOPY WITH MEDIAL MENISECTOMY;  Surgeon: Cristy Bonner DASEN, MD;  Location: [facility];  Service: Orthopedics;  Laterality: Right;   KNEE SURGERY     LEFT KNEE April 27 2017   SYNOVECTOMY Right 08/13/2022   Procedure: SYNOVECTOMY;  Surgeon: Cristy Bonner DASEN, MD;  Location: [facility];  Service: Orthopedics;  Laterality: Right;       Home Medications    Prior to Admission medications   Medication Sig Start Date End Date Taking? Authorizing Provider  AMBULATORY NON FORMULARY MEDICATION Take 2 tablets by mouth daily. Medication Name: focus factor    [provider]  amLODipine  (NORVASC ) 10 MG tablet Take 1 tablet (10 mg total) by mouth daily. 08/12/23   Alvan Dorothyann BIRCH, MD  amphetamine -dextroamphetamine  (ADDERALL) 10 MG tablet Take 1 tablet (10 mg  total) by mouth 2 (two) times daily. Patient not taking: Reported on 08/12/2023 03/24/23   Alvan Dorothyann BIRCH, MD  B Complex-Biotin-FA (SUPER B-COMPLEX PO) Take by mouth.    [provider]  Coenzyme Q10 (CO Q 10 PO) Take by mouth.    [provider]  Misc Natural Products (OSTEO BI-FLEX JOINT SHIELD PO) Take by mouth.    [provider]  Multiple Vitamin (MULTIVITAMIN) tablet Take 1 tablet by mouth daily.    [provider]  Omega-3 Fatty Acids (FISH OIL PO) Take by mouth daily.    [provider]  traZODone  (DESYREL ) 50 MG tablet Take 0.5-1 tablets (25-50 mg total) by mouth at bedtime as needed for sleep. 08/12/23   Alvan Dorothyann BIRCH, MD  valsartan  (DIOVAN ) 320 MG tablet Take 1 tablet (320 mg total) by mouth daily. 08/12/23   Alvan Dorothyann BIRCH, MD    Family History Family History  Problem Relation Age of Onset   Depression Mother    Hyperlipidemia Mother    Hypertension Mother        father   COPD Mother        smoker   Hypothyroidism Father    Hypothyroidism Brother    Heart defect Daughter        Tetralogy of Fallot    Colon cancer Neg Hx    Colon polyps Neg Hx    Esophageal cancer Neg Hx    Prostate cancer Neg Hx  Rectal cancer Neg Hx    Stomach cancer Neg Hx     Social History Social History   Tobacco Use   Smoking status: Never   Smokeless tobacco: Never  Vaping Use   Vaping status: Never Used  Substance Use Topics   Alcohol use: Yes    Comment: 3-4 X A WEEK   Drug use: No     Allergies   Penicillins and Hydrochlorothiazide   Review of Systems Review of Systems  HENT:  Positive for ear discharge.        Right ear bleeding x 12 hours per patient     Physical Exam Triage Vital Signs ED Triage Vitals [12/28/23 0817]  Encounter Vitals Group     BP (!) 153/89     Girls Systolic BP Percentile      Girls Diastolic BP Percentile      Boys Systolic BP Percentile      Boys Diastolic BP Percentile       Pulse Rate 73     Resp 20     Temp 98.1 F (36.7 C)     Temp src      SpO2 96 %     Weight      Height      Head Circumference      Peak Flow      Pain Score      Pain Loc      Pain Education      Exclude from Growth Chart    No data found.  Updated Vital Signs BP (!) 153/89   Pulse 73   Temp 98.1 F (36.7 C)   Resp 20   SpO2 96%    Physical Exam Vitals and nursing note reviewed.  Constitutional:      General: He is not in acute distress.    Appearance: Normal appearance. He is normal weight.  HENT:     Head: Normocephalic and atraumatic.     Right Ear: Tympanic membrane and ear canal normal.     Left Ear: Tympanic membrane, ear canal and external ear normal.     Ears:     Comments: Right outer ear (concha): 0.5 cm bean shaped bleeding cherry angioma noted, silver  nitrate sticks unsuccessful to achieve homeostasis this morning.  Gauze pads were used to occlude bleeding prior to discharge.    Mouth/Throat:     Mouth: Mucous membranes are moist.     Pharynx: Oropharynx is clear.  Eyes:     Extraocular Movements: Extraocular movements intact.     Pupils: Pupils are equal, round, and reactive to light.  Cardiovascular:     Rate and Rhythm: Normal rate and regular rhythm.     Heart sounds: Normal heart sounds.  Pulmonary:     Effort: Pulmonary effort is normal.     Breath sounds: Normal breath sounds. No wheezing, rhonchi or rales.  Musculoskeletal:        General: Normal range of motion.  Skin:    General: Skin is warm and dry.  Neurological:     General: No focal deficit present.     Mental Status: He is alert and oriented to person, place, and time. Mental status is at baseline.  Psychiatric:        Mood and Affect: Mood normal.        Behavior: Behavior normal.         UC Treatments / Results  Labs (all labs ordered are listed, but only abnormal results are displayed)  Labs Reviewed - No data to display  EKG   Radiology No results  found.  Procedures Procedures (including critical Kennedy time)  Medications Ordered in UC Medications - No data to display  Initial Impression / Assessment and Plan / UC Course  I have reviewed the triage vital signs and the nursing notes.  Pertinent labs & imaging results that were available during my Kennedy of the patient were reviewed by me and considered in my medical decision making (see chart for details).     MDM: 1.  Cherry angioma-tiny 0.5 cm bean shaped bleeding cherry angioma noted silver  nitrate unsuccessful to achieve homeostasis this morning 2.  Superficial injury of ear, right, initial encounter-silver  nitrate cautery sticks unsuccessful to achieve homeostasis patient instructed to follow-up with his PCP immediately for dermatology referral for cauterization or may go to [facility] ED for further evaluation.  Patient upset that we are unable to stop the bleeding this morning. Advised patient to follow-up with PCP or [facility] Dermatology for bleeding cherry angioma of right ear.  Advised patient if unable to stop bleeding please go to [facility] for further evaluation of bleeding cherry angioma.  Patient discharged home or to local ED, hemodynamically stable. Final Clinical Impressions(s) / UC Diagnoses   Final diagnoses:  Superficial injury of ear, right, initial encounter  Cherry angioma     Discharge Instructions      Advised patient to follow-up with PCP or [facility] Dermatology for bleeding cherry angioma of right ear.  Advised patient if unable to stop bleeding please go to [facility] for further evaluation of bleeding cherry angioma.     ED Prescriptions   None    PDMP not reviewed this encounter.   Teddy Sharper, FNP 12/28/23 [phone]

## 2023-12-28 NOTE — Discharge Instructions (Addendum)
 Advised patient to follow-up with PCP or [facility] Dermatology for bleeding cherry angioma of right ear.  Advised patient if unable to stop bleeding please go to [facility] for further evaluation of bleeding cherry angioma.

## 2023-12-28 NOTE — ED Triage Notes (Addendum)
 Right ear bleeding. Had red spot in ear a few weeks ago that began bleeding, then stopped. Yesterday attempted to put ear bud in and popped the lesion, has been bleeding again since then. No otc meds. Bleeding controlled with paper towel.

## 2023-12-28 NOTE — ED Triage Notes (Signed)
 Seen at [facility] and and was instructed to go to Dermatology. Dx with a cherry angioma. Had a red spot that he scratched and then formed a blood blister. Was trying to put headphone in ear yesterday and then started bleeding, been bleeding since (14hrs)

## 2023-12-28 NOTE — ED Provider Notes (Signed)
 [place] EM[facility]GENCY DEPARTMENT AT [facility] Provider Note   CSN: [id number] Arrival date & time: 12/28/23  9068     Patient presents with: Ear Drainage   Jesus Kennedy is a 58 y.o. male with history of hypertension, presents with concern for bleeding from his right ear that started yesterday.  Reports he had a red spot in his ear that he scratched, and then it formed a blood blister.  He then put earbuds in yesterday and this popped the blister and the site started to bleed.  He was evaluated by urgent care this morning for the bleeding.  They attempted to use silver  nitrate to help stop the bleeding, but this reportedly was not helping, and so he was sent to the emergency room for further evaluation and treatment.  He reports he is not on any anticoagulation.  Denies any other trauma to the ear.   HPI     Prior to Admission medications   Medication Sig Start Date End Date Taking? Authorizing Provider  AMBULATORY NON FORMULARY MEDICATION Take 2 tablets by mouth daily. Medication Name: focus factor    [provider]  amLODipine  (NORVASC ) 10 MG tablet Take 1 tablet (10 mg total) by mouth daily. 08/12/23   Alvan Dorothyann BIRCH, MD  amphetamine -dextroamphetamine  (ADDERALL) 10 MG tablet Take 1 tablet (10 mg total) by mouth 2 (two) times daily. Patient not taking: Reported on 08/12/2023 03/24/23   Alvan Dorothyann BIRCH, MD  B Complex-Biotin-FA (SUPER B-COMPLEX PO) Take by mouth.    [provider]  Coenzyme Q10 (CO Q 10 PO) Take by mouth.    [provider]  Misc Natural Products (OSTEO BI-FLEX JOINT SHIELD PO) Take by mouth.    [provider]  Multiple Vitamin (MULTIVITAMIN) tablet Take 1 tablet by mouth daily.    [provider]  Omega-3 Fatty Acids (FISH OIL PO) Take by mouth daily.    [provider]  traZODone  (DESYREL ) 50 MG tablet Take 0.5-1 tablets (25-50 mg total) by mouth at bedtime as needed for sleep. 08/12/23    Alvan Dorothyann BIRCH, MD  valsartan  (DIOVAN ) 320 MG tablet Take 1 tablet (320 mg total) by mouth daily. 08/12/23   Alvan Dorothyann BIRCH, MD    Allergies: Penicillins and Hydrochlorothiazide    Review of Systems  HENT:  Positive for ear discharge.     Updated Vital Signs BP (!) 142/110 (BP Location: Right Arm)   Pulse 75   Temp 97.8 F (36.6 C) (Oral)   Resp 18   Ht 5' 8 (1.727 m)   SpO2 100%   BMI 39.78 kg/m   Physical Exam Vitals and nursing note reviewed.  Constitutional:      Appearance: Normal appearance.  HENT:     Head: Atraumatic.     Comments: Scabbed over abrasion to the concha of the right ear.  Extremely mild slight bleeding upon arrival.  Bleeding is mostly stopped.    Right Ear: Tympanic membrane and ear canal normal.  Pulmonary:     Effort: Pulmonary effort is normal.  Neurological:     General: No focal deficit present.     Mental Status: He is alert.  Psychiatric:        Mood and Affect: Mood normal.        Behavior: Behavior normal.       (all labs ordered are listed, but only abnormal results are displayed) Labs Reviewed - No data to display  EKG: None  Radiology: No  results found.   Wound repair  Date/Time: 12/28/2023 9:55 AM  Performed by: Veta Palma, PA-C Authorized by: Veta Palma, PA-C  Consent: Verbal consent obtained Risks and benefits: risks, benefits and alternatives were discussed Consent given by: patient Patient identity confirmed: verbally with patient Local anesthesia used: no  Anesthesia: Local anesthesia used: no  Sedation: Patient sedated: no  Patient tolerance: patient tolerated the procedure well with no immediate complications Comments: Applied Silver  Nitrate to patient's ear bleeding, bleeding resolved after application      Medications Ordered in the ED  silver  nitrate applicators applicator 1 Application (1 Application Topical Given by Other 12/28/23 1017)                                     Medical Decision Making Risk Prescription drug management.     Differential diagnosis includes but is not limited to ear trauma, laceration  ED Course:  Upon initial evaluation, patient is very well-appearing.  Reporting bleeding from his right ear that started yesterday after patient had a spot in his right ear.  On exam, he does have a scabbed over abrasion vs skin tag on the concha of the right ear that has very mild bleeding from the site.  No bleeding from the ear canal.  The tympanic membrane is intact.  I applied silver  nitrate to the area which resolved the bleeding.  Patient was monitored for 30 minutes after silver  nitrate application, and bleeding still remains resolved.  Patient stable and appropriate for discharge home.  Medications Given: Silver  nitrate    Impression: Right ear abrasion bleeding, resolved  Disposition:  The patient was discharged home with instructions to refrain from using anything in the ear such as Q-tips, earbuds, to prevent further irritation of the ear.  If the underlying bleeding is from an abnormal skin growth, this may need to be biopsied by his PCP or dermatology.  Follow-up with PCP for recheck of the site within the next week. Return precautions given and patient verbalized understanding.    Record Review: External records from outside source obtained and reviewed including urgent care note from earlier today where silver  nitrate sticks were applied to the ear.     This chart was dictated using voice recognition software, Dragon. Despite the best efforts of this provider to proofread and correct errors, errors may still occur which can change documentation meaning.       Final diagnoses:  Ear abrasion, right, initial encounter    ED Discharge Orders     None          Veta Palma, DEVONNA 12/28/23 1022    Yolande Lamar BROCKS, MD 12/28/23 1306

## 2023-12-28 NOTE — ED Notes (Signed)
 Discharge instructions reviewed with patient. Patient verbalizes understanding, no further questions at this time. Medications and follow up information provided. No acute distress noted at time of departure.

## 2023-12-29 ENCOUNTER — Encounter: Payer: Self-pay | Admitting: Family Medicine

## 2023-12-31 NOTE — Telephone Encounter (Signed)
 Spoke with patient. States ear is still weeping blood.  He could not come in today until after 4pm due to work. Checked with Dr. Alvan but she does have a meeting that starts at 4 pm - she will not be available for the procedure today. She does recommend if the bleeding worsens or continues to go back to ER for cauterization and to call us  Monday 12/15/225 if still actively bleeding to be worked in for this cauterization to be done.  Patient informed and is agreeable to this plan.

## 2023-12-31 NOTE — Telephone Encounter (Signed)
 Pls call him, if still bleeding we can cauterize her this afternoon if needed.

## 2024-01-25 ENCOUNTER — Encounter: Payer: Self-pay | Admitting: Family Medicine

## 2024-01-25 ENCOUNTER — Ambulatory Visit: Admitting: Family Medicine

## 2024-01-25 ENCOUNTER — Other Ambulatory Visit: Payer: Self-pay | Admitting: Family Medicine

## 2024-01-25 VITALS — BP 130/72 | HR 71 | Ht 68.0 in | Wt 261.0 lb

## 2024-01-25 DIAGNOSIS — Z23 Encounter for immunization: Secondary | ICD-10-CM | POA: Diagnosis not present

## 2024-01-25 DIAGNOSIS — H6191 Disorder of right external ear, unspecified: Secondary | ICD-10-CM

## 2024-01-25 DIAGNOSIS — D2321 Other benign neoplasm of skin of right ear and external auricular canal: Secondary | ICD-10-CM

## 2024-01-25 NOTE — Progress Notes (Signed)
 "  Acute Office Visit  Patient ID: Jesus Kennedy, male    DOB: [birth year], 59 y.o.   MRN: [id number]  PCP: Alvan Dorothyann BIRCH, MD  No chief complaint on file.   Subjective:     HPI  Discussed the use of AI scribe software for clinical note transcription with the patient, who gave verbal consent to proceed.  History of Present Illness Jesus Kennedy is a 59 year old male who presents with a bleeding lesion on his ear.  Bleeding auricular lesion - Initial presentation as a small red dot on the ear, associated with pruritus - Scratching led to formation of a blood blister - Accidental trauma with earbud resulted in significant bleeding - Lesion has progressively enlarged, now approximately the size of a cherry angioma but increasing in size with each bleeding episode - Frequent bleeding, especially at night, resulting in blood-stained pillowcases - Lesion is covered by a scab; removal of the scab reveals a smaller lesion but causes continuous bleeding - Persistent pruritus with difficulty avoiding scratching - Previous treatments with silver  nitrate at urgent care and med center without resolution - Ongoing bleeding and lesion growth causing frustration and impacting daily life, including sleep disturbance due to lesion location and bleeding  Associated symptoms - No other symptoms reported aside from itching and bleeding of the lesion   ROS     Objective:    BP 130/72   Pulse 71   Ht 5' 8 (1.727 m)   Wt 261 lb (118.4 kg)   SpO2 99%   BMI 39.68 kg/m    Physical Exam HENT:     Head:     Comments: Approx 1.5 cm mass in the right ear, appear to be a granuloma        No results found for any visits on 01/25/24.     Assessment & Plan:   Problem List Items Addressed This Visit   None Visit Diagnoses       Encounter for immunization    -  Primary   Relevant Orders   Flu vaccine trivalent PF, 6mos and  older(Flulaval,Afluria,Fluarix,Fluzone) (Completed)     Lesion of right external ear       Relevant Orders   Surgical pathology       Assessment and Plan Assessment & Plan Benign neoplasm of skin of ear Lesion evolved into a granuloma with bleeding and itching. Wide base complicates excision. Previous silver  nitrate treatment ineffective. Size and location in ear's curve challenging for excision without specialized tools. - Referred to dermatology for evaluation and potential excision. - Advised to schedule dermatology appointment within a week. - Consider in-office excision and cauterization with lidocaine , iris scissors, and electric cautery if dermatology appointment delayed.  Skin excision  Date/Time: 01/25/2024 6:03 PM  Performed by: Alvan Dorothyann BIRCH, MD Authorized by: Alvan Dorothyann BIRCH, MD   Number of Lesions: 1 Lesion 1:    Body area: head/neck   Head/neck location: R ear   Initial size (mm): 15   Final defect size (mm): 15   Malignancy: malignancy unknown     Destruction method: scissors used for extraction     Repair comments: Electrocautery used to cauterize the base of the lesion.      Comments:  Sample sent to pathology.   F/U wound care discussed.    No orders of the defined types were placed in this encounter.   No follow-ups on file.  Dorothyann Alvan, MD [facility] at [employer]   "

## 2024-01-27 LAB — DERMATOLOGY PATHOLOGY

## 2024-01-28 ENCOUNTER — Ambulatory Visit: Payer: Self-pay | Admitting: Family Medicine

## 2024-01-28 NOTE — Progress Notes (Signed)
 Hi Jesus Kennedy, biopsy came back showing pyogenic granuloma.  This is, like we talked about it is basically a most like scar tissue that is trying to form and lay down to heal but it over produces and then it really is just not going to heal 1 where the other.  It is benign though which is great again mostly from trauma.  Hopefully the healing process is going well let us  know if you are having any problems or concerns.

## 2024-02-14 ENCOUNTER — Ambulatory Visit: Admitting: Family Medicine
